# Patient Record
Sex: Female | Born: 1990 | Race: White | Hispanic: No | Marital: Married | Smoking: Former smoker
Health system: Southern US, Community
[De-identification: ages and names within clinical notes are randomized; demographics above are authoritative.]

## PROBLEM LIST (undated history)

## (undated) ENCOUNTER — Inpatient Hospital Stay (HOSPITAL_COMMUNITY): Payer: Self-pay

## (undated) DIAGNOSIS — R87619 Unspecified abnormal cytological findings in specimens from cervix uteri: Secondary | ICD-10-CM

## (undated) DIAGNOSIS — R5383 Other fatigue: Secondary | ICD-10-CM

## (undated) DIAGNOSIS — Z3490 Encounter for supervision of normal pregnancy, unspecified, unspecified trimester: Secondary | ICD-10-CM

## (undated) DIAGNOSIS — K589 Irritable bowel syndrome without diarrhea: Secondary | ICD-10-CM

## (undated) DIAGNOSIS — N926 Irregular menstruation, unspecified: Principal | ICD-10-CM

## (undated) DIAGNOSIS — R011 Cardiac murmur, unspecified: Secondary | ICD-10-CM

## (undated) DIAGNOSIS — R87629 Unspecified abnormal cytological findings in specimens from vagina: Secondary | ICD-10-CM

## (undated) DIAGNOSIS — M5136 Other intervertebral disc degeneration, lumbar region: Secondary | ICD-10-CM

## (undated) DIAGNOSIS — Z789 Other specified health status: Secondary | ICD-10-CM

## (undated) DIAGNOSIS — IMO0002 Reserved for concepts with insufficient information to code with codable children: Secondary | ICD-10-CM

## (undated) DIAGNOSIS — M51369 Other intervertebral disc degeneration, lumbar region without mention of lumbar back pain or lower extremity pain: Secondary | ICD-10-CM

## (undated) DIAGNOSIS — R1032 Left lower quadrant pain: Secondary | ICD-10-CM

## (undated) DIAGNOSIS — B977 Papillomavirus as the cause of diseases classified elsewhere: Secondary | ICD-10-CM

## (undated) DIAGNOSIS — L409 Psoriasis, unspecified: Secondary | ICD-10-CM

## (undated) HISTORY — DX: Reserved for concepts with insufficient information to code with codable children: IMO0002

## (undated) HISTORY — DX: Other intervertebral disc degeneration, lumbar region: M51.36

## (undated) HISTORY — DX: Other fatigue: R53.83

## (undated) HISTORY — DX: Other intervertebral disc degeneration, lumbar region without mention of lumbar back pain or lower extremity pain: M51.369

## (undated) HISTORY — DX: Psoriasis, unspecified: L40.9

## (undated) HISTORY — DX: Unspecified abnormal cytological findings in specimens from vagina: R87.629

## (undated) HISTORY — PX: WISDOM TOOTH EXTRACTION: SHX21

## (undated) HISTORY — DX: Left lower quadrant pain: R10.32

## (undated) HISTORY — DX: Unspecified abnormal cytological findings in specimens from cervix uteri: R87.619

## (undated) HISTORY — DX: Cardiac murmur, unspecified: R01.1

## (undated) HISTORY — PX: COLONOSCOPY: SHX174

## (undated) HISTORY — DX: Irregular menstruation, unspecified: N92.6

## (undated) NOTE — *Deleted (*Deleted)
[facility] EMERGENCY DEPARTMENT Provider Note   CSN: [id number] Arrival date & time: 08/17/20  2149     History Chief Complaint  Patient presents with  . Dizziness    [name] is a 51 y.o. female.  HPI     Past Medical History:  Diagnosis Date  . Abnormal Pap smear   . Fatigue 01/09/2015  . HPV (human papilloma virus) infection   . Irritable bowel syndrome (IBS)   . LLQ pain 01/09/2015  . Missed period 01/09/2015  . No pertinent past medical history   . Pregnant   . Vaginal Pap smear, abnormal     Patient Active Problem List   Diagnosis Date Noted  . Fatigue 01/09/2015  . Missed period 01/09/2015  . LLQ pain 01/09/2015  . Monilial vulvovaginitis 01/21/2014  . Boil of vulva 01/18/2014  . Smoker 05/01/2013    Past Surgical History:  Procedure Laterality Date  . CESAREAN SECTION  08/03/2012   Procedure: CESAREAN SECTION;  Surgeon: Tilda Burrow, MD;  Location: [facility];  Service: Obstetrics;  Laterality: N/A;  . COLONOSCOPY    . VULVAR LESION REMOVAL Left 09/11/2014   Procedure: EXCISION OF LEFT VULVAR ABSCESS WITH COMPLEX REPAIR;  Surgeon: Lazaro Arms, MD;  Location: [facility];  Service: Gynecology;  Laterality: Left;  . WISDOM TOOTH EXTRACTION       OB History    Gravida  2   Para  2   Term  2   Preterm  0   AB  0   Living  2     SAB  0   TAB  0   Ectopic  0   Multiple  0   Live Births  2           Family History  Problem Relation Age of Onset  . Miscarriages / [place] Mother   . Heart attack Mother        cardiac arrest with heart attack  . Heart disease Maternal Grandmother   . Cancer Maternal Grandfather        prostate  . Heart disease Maternal Grandfather   . Diabetes Paternal Grandmother   . Heart murmur Father   . Other Neg Hx     Social History   Tobacco Use  . Smoking status: Former Smoker    Packs/day: 0.25    Years: 7.00    Pack years: 1.75    Types: Cigarettes    Quit date: 02/08/2014    Years since  quitting: 6.5  . Smokeless tobacco: Never Used  Vaping Use  . Vaping Use: Some days  Substance Use Topics  . Alcohol use: Yes    Comment: weekly- couple drinks a week   . Drug use: No    Home Medications Prior to Admission medications   Medication Sig Start Date End Date Taking? Authorizing Provider  acetaminophen (TYLENOL) 500 MG tablet Take 500 mg by mouth every 6 (six) hours as needed.    [provider]  ibuprofen (ADVIL,MOTRIN) 200 MG tablet Take 200 mg by mouth as needed.    [provider]    Allergies    Sulfa drugs cross reactors  Review of Systems   Review of Systems  Physical Exam Updated Vital Signs BP (!) 132/93   Pulse 70   Temp 98.3 F (36.8 C) (Oral)   Resp 18   Ht 4\' 11"  (1.499 m)   Wt 55.3 kg   SpO2 100%   BMI 24.64 kg/m  Physical Exam  ED Results / Procedures / Treatments   Labs (all labs ordered are listed, but only abnormal results are displayed) Labs Reviewed  CBC WITH DIFFERENTIAL/PLATELET  BASIC METABOLIC PANEL  I-STAT BETA HCG BLOOD, ED ([facility], [facility], [facility] ONLY)    EKG None  Radiology No results found.  Procedures Procedures (including critical care time)  Medications Ordered in ED Medications - No data to display  ED Course  I have reviewed the triage vital signs and the nursing notes.  Pertinent labs & imaging results that were available during my care of the patient were reviewed by me and considered in my medical decision making (see chart for details).    MDM Rules/Calculators/A&P                          *** Final Clinical Impression(s) / ED Diagnoses Final diagnoses:  None    Rx / DC Orders ED Discharge Orders    None

---
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
## 2010-07-17 ENCOUNTER — Inpatient Hospital Stay (HOSPITAL_COMMUNITY): Admission: AD | Admit: 2010-07-17 | Discharge: 2010-07-17 | Payer: Self-pay | Admitting: Obstetrics and Gynecology

## 2010-09-11 ENCOUNTER — Inpatient Hospital Stay (HOSPITAL_COMMUNITY)
Admission: AD | Admit: 2010-09-11 | Discharge: 2010-09-11 | Payer: Self-pay | Source: Home / Self Care | Attending: Obstetrics and Gynecology | Admitting: Obstetrics and Gynecology

## 2010-12-14 LAB — URINALYSIS, ROUTINE W REFLEX MICROSCOPIC
Glucose, UA: NEGATIVE mg/dL
Ketones, ur: NEGATIVE mg/dL
Protein, ur: NEGATIVE mg/dL

## 2010-12-17 LAB — HCG, SERUM, QUALITATIVE: Preg, Serum: NEGATIVE

## 2010-12-17 LAB — URINALYSIS, ROUTINE W REFLEX MICROSCOPIC
Glucose, UA: NEGATIVE mg/dL
Hgb urine dipstick: NEGATIVE
Protein, ur: NEGATIVE mg/dL
Specific Gravity, Urine: 1.01 (ref 1.005–1.030)

## 2010-12-17 LAB — WET PREP, GENITAL: Yeast Wet Prep HPF POC: NONE SEEN

## 2010-12-17 LAB — GC/CHLAMYDIA PROBE AMP, GENITAL: Chlamydia, DNA Probe: NEGATIVE

## 2010-12-17 LAB — POCT PREGNANCY, URINE: Preg Test, Ur: NEGATIVE
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
## 2011-06-25 LAB — ACETAMINOPHEN LEVEL: Acetaminophen (Tylenol), Serum: <10 — ABNORMAL LOW

## 2011-06-25 LAB — URINE CULTURE: Colony Count: >=100000

## 2011-06-25 LAB — URINALYSIS, ROUTINE W REFLEX MICROSCOPIC
Bilirubin Urine: NEGATIVE
Glucose, UA: NEGATIVE
Hgb urine dipstick: NEGATIVE
Ketones, ur: NEGATIVE
pH: 7

## 2011-06-25 LAB — RAPID URINE DRUG SCREEN, HOSP PERFORMED
Barbiturates: NOT DETECTED
Opiates: NOT DETECTED
Tetrahydrocannabinol: NOT DETECTED

## 2011-06-25 LAB — I-STAT 8, (EC8 V) (CONVERTED LAB)
BUN: 7
Bicarbonate: 27.1 — ABNORMAL HIGH
Glucose, Bld: 82
Sodium: 140

## 2011-06-25 LAB — POCT I-STAT CREATININE: Creatinine, Ser: 0.9

## 2011-06-25 LAB — URINE MICROSCOPIC-ADD ON

## 2011-06-25 LAB — POCT PREGNANCY, URINE: Preg Test, Ur: NEGATIVE

## 2011-06-30 LAB — URINALYSIS, ROUTINE W REFLEX MICROSCOPIC
Glucose, UA: NEGATIVE
Nitrite: NEGATIVE
Protein, ur: NEGATIVE
Urobilinogen, UA: 0.2

## 2011-06-30 LAB — POCT PREGNANCY, URINE: Preg Test, Ur: NEGATIVE

## 2011-07-09 LAB — POCT PREGNANCY, URINE: Operator id: 148111

## 2011-07-22 LAB — POCT URINALYSIS DIP (DEVICE)
Hgb urine dipstick: NEGATIVE
Ketones, ur: NEGATIVE
Protein, ur: NEGATIVE
Specific Gravity, Urine: 1.02
pH: 6

## 2011-12-05 ENCOUNTER — Encounter (HOSPITAL_COMMUNITY): Payer: Self-pay | Admitting: *Deleted

## 2011-12-05 ENCOUNTER — Inpatient Hospital Stay (HOSPITAL_COMMUNITY)
Admission: AD | Admit: 2011-12-05 | Discharge: 2011-12-05 | Disposition: A | Discharge status: Home / Self Care | Source: Ambulatory Visit | Attending: Obstetrics & Gynecology | Admitting: Obstetrics & Gynecology

## 2011-12-05 ENCOUNTER — Inpatient Hospital Stay (HOSPITAL_COMMUNITY)

## 2011-12-05 DIAGNOSIS — O34599 Maternal care for other abnormalities of gravid uterus, unspecified trimester: Secondary | ICD-10-CM | POA: Insufficient documentation

## 2011-12-05 DIAGNOSIS — O200 Threatened abortion: Secondary | ICD-10-CM

## 2011-12-05 DIAGNOSIS — N831 Corpus luteum cyst of ovary, unspecified side: Secondary | ICD-10-CM | POA: Insufficient documentation

## 2011-12-05 DIAGNOSIS — R1032 Left lower quadrant pain: Secondary | ICD-10-CM | POA: Insufficient documentation

## 2011-12-05 HISTORY — DX: Papillomavirus as the cause of diseases classified elsewhere: B97.7

## 2011-12-05 HISTORY — DX: Irritable bowel syndrome, unspecified: K58.9

## 2011-12-05 LAB — POCT PREGNANCY, URINE: Preg Test, Ur: POSITIVE — AB

## 2011-12-05 LAB — URINALYSIS, ROUTINE W REFLEX MICROSCOPIC
Bilirubin Urine: NEGATIVE
Glucose, UA: NEGATIVE mg/dL
Ketones, ur: NEGATIVE mg/dL
Specific Gravity, Urine: >1.03 — ABNORMAL HIGH (ref 1.005–1.030)
pH: 6 (ref 5.0–8.0)

## 2011-12-05 LAB — URINE MICROSCOPIC-ADD ON

## 2011-12-05 LAB — ABO/RH: ABO/RH(D): O POS

## 2011-12-05 MED ORDER — PRENATAL RX 60-1 MG PO TABS: 1.0000 | ORAL_TABLET | Freq: Every day | ORAL | Status: DC

## 2011-12-05 NOTE — Progress Notes (Signed)
M. Williams, CNM at bedside.  Assessment done and poc discussed with pt.   

## 2011-12-05 NOTE — ED Provider Notes (Signed)
History     Chief Complaint  Patient presents with  . Possible Pregnancy   HPI This is a 21 y.o. at [redacted] weeks gestation by LMP who presents with c/o LLQ pain and positive pregnancy tests.  Is worried about ectopic because she had a HSG a year ago and thought it might be a risk for ectopic. No bleeding.  OB History    Grav Para Term Preterm Abortions TAB SAB Ect Mult Living   2 0 0 0 0 0 0 0 0 0       Past Medical History  Diagnosis Date  . HPV (human papilloma virus) infection   . Irritable bowel syndrome (IBS)     Past Surgical History  Procedure Date  . Wisdom tooth extraction     History reviewed. No pertinent family history.  History  Substance Use Topics  . Smoking status: Current Everyday Smoker -- 0.2 packs/day  . Smokeless tobacco: Not on file  . Alcohol Use: Yes    Allergies:  Allergies  Allergen Reactions  . Sulfa Drugs Cross Reactors Hives    Prescriptions prior to admission  Medication Sig Dispense Refill  . fluocinonide gel (LIDEX) 0.05 % Apply 1 application topically daily as needed. For psoriasis        ROS As Above  Physical Exam   Blood pressure 115/60, pulse 94, temperature 97.6 F (36.4 C), temperature source Oral, resp. rate 20, height 4\' 11"  (1.499 m), weight 118 lb (53.524 kg), last menstrual period 11/04/2011.  Physical Exam  Constitutional: She is oriented to person, place, and time. She appears well-developed and well-nourished. No distress.  HENT:  Head: Normocephalic.  Cardiovascular: Normal rate.   Respiratory: Effort normal.  GI: Soft. She exhibits no distension and no mass. There is tenderness (mild on LLQ). There is no rebound and no guarding.  Genitourinary: Vagina normal and uterus normal. No vaginal discharge found.       Uterus nontender.  Slight tenderness LLQ, no blood in vault  Musculoskeletal: Normal range of motion.  Neurological: She is alert and oriented to person, place, and time.  Skin: Skin is warm and dry.    Psychiatric: She has a normal mood and affect.   Results for orders placed during the hospital encounter of 12/05/11 (from the past 24 hour(s))  URINALYSIS, ROUTINE W REFLEX MICROSCOPIC     Status: Abnormal   Collection Time   12/05/11  8:03 PM      Component Value Range   Color, Urine YELLOW  YELLOW    APPearance CLEAR  CLEAR    Specific Gravity, Urine >1.030 (*) 1.005 - 1.030    pH 6.0  5.0 - 8.0    Glucose, UA NEGATIVE  NEGATIVE (mg/dL)   Hgb urine dipstick TRACE (*) NEGATIVE    Bilirubin Urine NEGATIVE  NEGATIVE    Ketones, ur NEGATIVE  NEGATIVE (mg/dL)   Protein, ur NEGATIVE  NEGATIVE (mg/dL)   Urobilinogen, UA 0.2  0.0 - 1.0 (mg/dL)   Nitrite NEGATIVE  NEGATIVE    Leukocytes, UA NEGATIVE  NEGATIVE   URINE MICROSCOPIC-ADD ON     Status: Abnormal   Collection Time   12/05/11  8:03 PM      Component Value Range   Squamous Epithelial / LPF FEW (*) RARE    WBC, UA 0-2  <3 (WBC/hpf)   RBC / HPF 0-2  <3 (RBC/hpf)   Bacteria, UA FEW (*) RARE   POCT PREGNANCY, URINE     Status: Abnormal  Collection Time   12/05/11  8:07 PM      Component Value Range   Preg Test, Ur POSITIVE (*) NEGATIVE   HCG, QUANTITATIVE, PREGNANCY     Status: Abnormal   Collection Time   12/05/11  9:05 PM      Component Value Range   hCG, Beta Chain, Quant, S 1319 (*) <5 (mIU/mL)  ABO/RH     Status: Normal   Collection Time   12/05/11  9:08 PM      Component Value Range   ABO/RH(D) O POS    WET PREP, GENITAL     Status: Abnormal   Collection Time   12/05/11  9:32 PM      Component Value Range   Yeast Wet Prep HPF POC NONE SEEN  NONE SEEN    Trich, Wet Prep NONE SEEN  NONE SEEN    Clue Cells Wet Prep HPF POC FEW (*) NONE SEEN    WBC, Wet Prep HPF POC FEW (*) NONE SEEN      US Ob Transvaginal  12/05/2011  *RADIOLOGY REPORT*  Clinical Data: Lower quadrant pain.  OBSTETRIC <14 WK [place] AND TRANSVAGINAL OB US  Technique:  Both transabdominal and transvaginal ultrasound examinations were performed for complete  evaluation of the gestation as well as the maternal uterus, adnexal regions, and pelvic cul-de-sac.  Transvaginal technique was performed to assess early pregnancy.  Comparison:  None.  Intrauterine gestational sac:  Single Yolk sac: No Embryo: No Cardiac Activity: No  MSD: 2.7  mm  4    w 5    d [place] EDC: 08/08/2012  Maternal uterus/adnexae: 2.1 cm cyst on the right ovary, probably a corpus luteum.  Left ovary is normal. No free fluid.  IMPRESSION: Probable very early intrauterine pregnancy.  Original Report Authenticated By: Gwynn Burly, M.D.   MAU Course  Procedures  Assessment and Plan  A:  Pregnancy at 4.5 weeks      Cannot rule out ectopic      R CLC P:  Reviewed with patient and her husband       Will repeat Quant in 48 hrs        Followup US in 1-2 weeks  [facility] 12/05/2011, 11:07 PM

## 2011-12-05 NOTE — Discharge Instructions (Signed)
Threatened Miscarriage  A threatened miscarriage is a pregnancy that may end. It may be marked by bleeding during the first 20 weeks of pregnancy. Often, the pregnancy can continue without any more problems. You may be asked to stop:  Having sex (intercourse).   Having orgasms.   Using tampons.   Exercising.   Doing heavy physical activity and work.  HOME CARE   Your doctor may tell you to take bed rest and to stop activities and work.   Write down the number of pads you use each day. Write down how often you change pads. Write down how soaked they are.   Follow your doctor's advice for follow-up visits and tests.   If your blood type is Rh-negative and the father's blood is Rh-positive (or is not known), you may get a shot to protect the baby.   If you have a miscarriage, save all the tissue you pass in a container. Take the container to your doctor.  GET HELP RIGHT AWAY IF:   You have bad cramps or pain in your belly (abdomen), lower belly, or back.   You have a fever or chills.   Your bleeding gets worse or you pass large clots of blood or tissue. Save this tissue to show your doctor.   You feel lightheaded, weak, dizzy, or pass out (faint).   You have a gush of fluid from your vagina.  MAKE SURE YOU:   Understand these instructions.   Will watch your condition.   Will get help right away if you are not doing well or get worse.  Document Released: 09/02/2008 Document Revised: 09/09/2011 Document Reviewed: 10/06/2009 ExitCare Patient Information 2012 [employer]. 

## 2011-12-05 NOTE — Progress Notes (Signed)
SSE per CNM.  Wet prep and cultures collected.  VE done.  

## 2011-12-06 LAB — GC/CHLAMYDIA PROBE AMP, GENITAL: GC Probe Amp, Genital: NEGATIVE

## 2011-12-07 ENCOUNTER — Encounter (HOSPITAL_COMMUNITY): Payer: Self-pay | Admitting: *Deleted

## 2011-12-07 ENCOUNTER — Inpatient Hospital Stay (HOSPITAL_COMMUNITY)
Admission: AD | Admit: 2011-12-07 | Discharge: 2011-12-07 | Disposition: A | Discharge status: Home / Self Care | Source: Ambulatory Visit | Attending: Obstetrics & Gynecology | Admitting: Obstetrics & Gynecology

## 2011-12-07 DIAGNOSIS — R109 Unspecified abdominal pain: Secondary | ICD-10-CM | POA: Insufficient documentation

## 2011-12-07 DIAGNOSIS — O26899 Other specified pregnancy related conditions, unspecified trimester: Secondary | ICD-10-CM

## 2011-12-07 DIAGNOSIS — O99891 Other specified diseases and conditions complicating pregnancy: Secondary | ICD-10-CM | POA: Insufficient documentation

## 2011-12-07 HISTORY — DX: Other specified health status: Z78.9

## 2011-12-07 LAB — HCG, QUANTITATIVE, PREGNANCY: hCG, Beta Chain, Quant, S: 2912 m[IU]/mL — ABNORMAL HIGH (ref <5)

## 2011-12-07 LAB — URINE CULTURE
Colony Count: 5000
Culture  Setup Time: 201303040431

## 2011-12-07 NOTE — ED Provider Notes (Signed)
History   Chief Complaint:  Follow-up   [name] is  21 y.o. G2P0000 Patient's last menstrual period was 11/04/2011.Marland Kitchen Patient is here for follow up of quantitative HCG and ongoing surveillance of pregnancy status.     Since her last visit, the patient is without new complaint.   The patient reports bleeding as  none now.    General ROS:  negative  Her previous Quantitative HCG values are:12/05/2011: 1319    Physical Exam   Blood pressure 98/52, temperature 98.2 F (36.8 C), temperature source Oral, resp. rate 18, height 4' 10.5" (1.486 m), weight 116 lb 2 oz (52.674 kg), last menstrual period 11/04/2011.  Focused Gynecological Exam: examination not indicated  Labs: Recent Results (from the past 24 hour(s))  HCG, QUANTITATIVE, PREGNANCY   Collection Time   12/07/11  2:55 PM      Component Value Range   hCG, Beta Chain, Quant, S 2912 (*) <5 (mIU/mL)    Ultrasound Studies:   US Ob Comp Less 14 Wks  12/05/2011  *RADIOLOGY REPORT*  Clinical Data: Lower quadrant pain.  OBSTETRIC <14 WK [place] AND TRANSVAGINAL OB US  Technique:  Both transabdominal and transvaginal ultrasound examinations were performed for complete evaluation of the gestation as well as the maternal uterus, adnexal regions, and pelvic cul-de-sac.  Transvaginal technique was performed to assess early pregnancy.  Comparison:  None.  Intrauterine gestational sac:  Single Yolk sac: No Embryo: No Cardiac Activity: No  MSD: 2.7  mm  4    w 5    d [place] EDC: 08/08/2012  Maternal uterus/adnexae: 2.1 cm cyst on the right ovary, probably a corpus luteum.  Left ovary is normal. No free fluid.  IMPRESSION: Probable very early intrauterine pregnancy.  Original Report Authenticated By: Gwynn Burly, M.D.    Assessment: + UPT with normally rising Quants   Plan: Discharge home FU 12/22/11 @ 2:30pm for Repeat viability [place] Referral to [facility] for [facility]  Channon Ambrosini E. 12/07/2011, 4:55 PM

## 2011-12-07 NOTE — Progress Notes (Signed)
Pt states, " I am here for a repeat BHCG."

## 2011-12-07 NOTE — Discharge Instructions (Signed)
Abdominal Pain During Pregnancy  Abdominal discomfort is common in pregnancy. Most of the time, it does not cause harm. There are many causes of abdominal pain. Some causes are more serious than others. Some of the causes of abdominal pain in pregnancy are easily diagnosed. Occasionally, the diagnosis takes time to understand. Other times, the cause is not determined. Abdominal pain can be a sign that something is very wrong with the pregnancy, or the pain may have nothing to do with the pregnancy at all. For this reason, always tell your caregiver if you have any abdominal discomfort.  CAUSES  Common and harmless causes of abdominal pain include:   Constipation.   Excess gas and bloating.   Round ligament pain. This is pain that is felt in the folds of the groin.   The position the baby or placenta is in.   Baby kicks.   Braxton-Hicks contractions. These are mild contractions that do not cause cervical dilation.  Serious causes of abdominal pain include:   Ectopic pregnancy. This happens when a fertilized egg implants outside of the uterus.   Miscarriage.   Preterm labor. This is when labor starts at less than 37 weeks of pregnancy.   Placental abruption. This is when the placenta partially or completely separates from the uterus.   Preeclampsia. This is often associated with high blood pressure and has been referred to as "toxemia in pregnancy."   Uterine or amniotic fluid infections.  Causes unrelated to pregnancy include:   Urinary tract infection.   Gallbladder stones or inflammation.   Hepatitis or other liver illness.   Intestinal problems, stomach flu, food poisoning, or ulcer.   Appendicitis.   Kidney (renal) stones.   Kidney infection (pylonephritis).  HOME CARE INSTRUCTIONS   For mild pain:   Do not have sexual intercourse or put anything in your vagina until your symptoms go away completely.   Get plenty of rest until your pain improves. If your pain does not improve in 1 hour, call  your caregiver.   Drink clear fluids if you feel nauseous. Avoid solid food as long as you are uncomfortable or nauseous.   Only take medicine as directed by your caregiver.   Keep all follow-up appointments with your caregiver.  SEEK IMMEDIATE MEDICAL CARE IF:   You are bleeding, leaking fluid, or passing tissue from the vagina.   You have increasing pain or cramping.   You have persistent vomiting.   You have painful or bloody urination.   You have a fever.   You notice a decrease in your baby's movements.   You have extreme weakness or feel faint.   You have shortness of breath, with or without abdominal pain.   You develop a severe headache with abdominal pain.   You have abnormal vaginal discharge with abdominal pain.   You have persistent diarrhea.   You have abdominal pain that continues even after rest, or gets worse.  MAKE SURE YOU:    Understand these instructions.   Will watch your condition.   Will get help right away if you are not doing well or get worse.  Document Released: 09/20/2005 Document Revised: 09/09/2011 Document Reviewed: 04/16/2011  ExitCare Patient Information 2012 [employer].

## 2011-12-07 NOTE — ED Provider Notes (Signed)
Medical Screening exam and patient care preformed by advanced practice provider.  Agree with the above management.  

## 2011-12-22 ENCOUNTER — Encounter (HOSPITAL_COMMUNITY): Payer: Self-pay

## 2011-12-22 ENCOUNTER — Other Ambulatory Visit (HOSPITAL_COMMUNITY): Payer: Self-pay | Admitting: Physician Assistant

## 2011-12-22 ENCOUNTER — Ambulatory Visit (HOSPITAL_COMMUNITY)
Admit: 2011-12-22 | Discharge: 2011-12-22 | Disposition: A | Discharge status: Home / Self Care | Attending: Obstetrics & Gynecology | Admitting: Obstetrics & Gynecology

## 2011-12-22 DIAGNOSIS — O26899 Other specified pregnancy related conditions, unspecified trimester: Secondary | ICD-10-CM

## 2011-12-22 DIAGNOSIS — R109 Unspecified abdominal pain: Secondary | ICD-10-CM

## 2011-12-22 DIAGNOSIS — O3680X0 Pregnancy with inconclusive fetal viability, not applicable or unspecified: Secondary | ICD-10-CM | POA: Insufficient documentation

## 2011-12-22 DIAGNOSIS — Z3689 Encounter for other specified antenatal screening: Secondary | ICD-10-CM | POA: Insufficient documentation

## 2012-01-05 ENCOUNTER — Other Ambulatory Visit (HOSPITAL_COMMUNITY)
Admission: RE | Admit: 2012-01-05 | Discharge: 2012-01-05 | Disposition: A | Discharge status: Home / Self Care | Source: Ambulatory Visit | Attending: Obstetrics and Gynecology | Admitting: Obstetrics and Gynecology

## 2012-01-05 ENCOUNTER — Other Ambulatory Visit: Payer: Self-pay | Admitting: Adult Health

## 2012-01-05 DIAGNOSIS — Z01419 Encounter for gynecological examination (general) (routine) without abnormal findings: Secondary | ICD-10-CM | POA: Insufficient documentation

## 2012-01-05 LAB — OB RESULTS CONSOLE ABO/RH: RH Type: POSITIVE

## 2012-01-05 LAB — OB RESULTS CONSOLE HEPATITIS B SURFACE ANTIGEN: Hepatitis B Surface Ag: NEGATIVE

## 2012-01-05 LAB — OB RESULTS CONSOLE HIV ANTIBODY (ROUTINE TESTING): HIV: NONREACTIVE

## 2012-06-05 ENCOUNTER — Emergency Department (HOSPITAL_COMMUNITY)
Admission: EM | Admit: 2012-06-05 | Discharge: 2012-06-05 | Disposition: A | Discharge status: Home / Self Care | Attending: Emergency Medicine | Admitting: Emergency Medicine

## 2012-06-05 ENCOUNTER — Encounter (HOSPITAL_COMMUNITY): Payer: Self-pay | Admitting: *Deleted

## 2012-06-05 DIAGNOSIS — Z3490 Encounter for supervision of normal pregnancy, unspecified, unspecified trimester: Secondary | ICD-10-CM

## 2012-06-05 DIAGNOSIS — O99330 Smoking (tobacco) complicating pregnancy, unspecified trimester: Secondary | ICD-10-CM | POA: Insufficient documentation

## 2012-06-05 DIAGNOSIS — O368190 Decreased fetal movements, unspecified trimester, not applicable or unspecified: Secondary | ICD-10-CM | POA: Insufficient documentation

## 2012-06-05 DIAGNOSIS — Z882 Allergy status to sulfonamides status: Secondary | ICD-10-CM | POA: Insufficient documentation

## 2012-06-05 HISTORY — DX: Encounter for supervision of normal pregnancy, unspecified, unspecified trimester: Z34.90

## 2012-06-05 LAB — URINALYSIS, ROUTINE W REFLEX MICROSCOPIC
Glucose, UA: 100 mg/dL — AB
Leukocytes, UA: NEGATIVE
Nitrite: NEGATIVE
Protein, ur: NEGATIVE mg/dL
pH: 6 (ref 5.0–8.0)

## 2012-06-05 NOTE — ED Notes (Addendum)
Pt says she had NVD last pm.  Today has had decreased fetal movement and  Pressure.  Alert, No vag bleeding or cramping.

## 2012-06-05 NOTE — ED Notes (Signed)
Pt states she and her husband had some food las night that upset their stomach and today she noticed the baby was not moving as much. Pt became concerned about the decreased fetal movement and came to the ER.

## 2012-06-05 NOTE — ED Provider Notes (Signed)
History     CSN: [id number]  Arrival date & time 06/05/12  4098   First MD Initiated Contact with Patient 06/05/12 1901      Chief Complaint  Patient presents with  . Decreased Fetal Movement    (Consider location/radiation/quality/duration/timing/severity/associated sxs/prior treatment) HPI Comments: G1P0 female comes in with cc of decreased fetal movements. PT is in her 3rd trimester. States that today, she has not felt any fetal movement  - until shew got to the ED, and patient got scared. She has some crampy lower quadrant pain and lower back pain - but no UTI like sx, and no vaginal bleeding, discharge.  Pt denies trauma.   The history is provided by the patient.    Past Medical History  Diagnosis Date  . HPV (human papilloma virus) infection   . Irritable bowel syndrome (IBS)   . No pertinent past medical history   . Pregnant     Past Surgical History  Procedure Date  . Wisdom tooth extraction     History reviewed. No pertinent family history.  History  Substance Use Topics  . Smoking status: Current Everyday Smoker -- 0.2 packs/day    Types: Cigarettes  . Smokeless tobacco: Not on file  . Alcohol Use: No    OB History    Grav Para Term Preterm Abortions TAB SAB Ect Mult Living   1 0 0 0 0 0 0 0 0 0       Review of Systems  Constitutional: Positive for activity change.  HENT: Negative for neck pain.   Respiratory: Negative for shortness of breath.   Cardiovascular: Negative for chest pain.  Gastrointestinal: Positive for abdominal pain. Negative for nausea and vomiting.  Genitourinary: Negative for dysuria, frequency, decreased urine volume, vaginal bleeding and vaginal discharge.  Musculoskeletal: Positive for back pain.  Neurological: Negative for headaches.    Allergies  Sulfa drugs cross reactors  Home Medications   Current Outpatient Rx  Name Route Sig Dispense Refill  . OVER THE COUNTER MEDICATION Oral Take 1 tablet by mouth at  bedtime. ONE-A-DAY WOMENS with DHA      BP 138/63  Pulse 85  Temp 98.4 F (36.9 C) (Oral)  Resp 20  Ht 4\' 11"  (1.499 m)  Wt 150 lb (68.04 kg)  BMI 30.30 kg/m2  SpO2 100%  LMP 11/04/2011  Physical Exam  Nursing note and vitals reviewed. Constitutional: She is oriented to person, place, and time. She appears well-developed and well-nourished.  HENT:  Head: Normocephalic and atraumatic.  Eyes: EOM are normal. Pupils are equal, round, and reactive to light.  Neck: Neck supple.  Cardiovascular: Normal rate, regular rhythm and normal heart sounds.   No murmur heard. Pulmonary/Chest: Effort normal. No respiratory distress.  Abdominal: Soft. She exhibits no distension. There is no tenderness. There is no rebound and no guarding.  Genitourinary:       Pt on fetal heart monitoring - HR of the fetus is between 150 and 170 during MD evaluation  Neurological: She is alert and oriented to person, place, and time.  Skin: Skin is warm and dry.    ED Course  Procedures (including critical care time)  Labs Reviewed  URINALYSIS, ROUTINE W REFLEX MICROSCOPIC - Abnormal; Notable for the following:    Glucose, UA 100 (*)     All other components within normal limits  URINE CULTURE   No results found.   1. Pregnancy       MDM  Pregnant woman comes in with  cc of not feeling fetal movements. In the ED fetal heart tones are WNL, and patient is feeling the movement now. With the suprapubic pain and back pain, will check a UA. - UCx.       Derwood Kaplan, MD 06/05/12 2207

## 2012-06-05 NOTE — Progress Notes (Signed)
Call from [facility] ED regarding pt who has presented to ED with c/o decrease FM. Pt denies any pain or other complaints. EFM applied. Pt is seen for OB care at [facility].

## 2012-06-06 LAB — URINE CULTURE: Colony Count: >=100000

## 2012-06-07 NOTE — ED Notes (Signed)
Chart returned from EDP office   

## 2012-06-10 NOTE — ED Notes (Signed)
+  Urine. Chart sent to EDP office for review. Chart returned from EDP office. "If patient not treated with abx and if no allergies: Keflex 500 mg 1 QID #40." Prescribed by Grant Fontana PA-C.

## 2012-06-11 NOTE — ED Notes (Signed)
Rx called to [facility] in [place] by Steward Ros PFM.

## 2012-06-11 NOTE — ED Notes (Signed)
Called patient and informed them of +result and new Rx. Wants Rx called to [facility] in [place].

## 2012-06-26 ENCOUNTER — Inpatient Hospital Stay (HOSPITAL_COMMUNITY)
Admission: AD | Admit: 2012-06-26 | Discharge: 2012-06-26 | Disposition: A | Discharge status: Home / Self Care | Source: Ambulatory Visit | Attending: Obstetrics & Gynecology | Admitting: Obstetrics & Gynecology

## 2012-06-26 ENCOUNTER — Encounter (HOSPITAL_COMMUNITY): Payer: Self-pay | Admitting: *Deleted

## 2012-06-26 DIAGNOSIS — O99891 Other specified diseases and conditions complicating pregnancy: Secondary | ICD-10-CM | POA: Insufficient documentation

## 2012-06-26 DIAGNOSIS — N949 Unspecified condition associated with female genital organs and menstrual cycle: Secondary | ICD-10-CM

## 2012-06-26 DIAGNOSIS — O9989 Other specified diseases and conditions complicating pregnancy, childbirth and the puerperium: Secondary | ICD-10-CM

## 2012-06-26 DIAGNOSIS — R109 Unspecified abdominal pain: Secondary | ICD-10-CM | POA: Insufficient documentation

## 2012-06-26 NOTE — MAU Note (Signed)
Pt states lower abd cramping constant pain started today about 1400.  Denies vaginal bleeding or ROM.  Good fetal movement.

## 2012-06-26 NOTE — MAU Provider Note (Signed)
  History     CSN: [id number]  Arrival date and time: 06/26/12 2131   First Provider Initiated Contact with Patient 06/26/12 2229      Chief Complaint  Patient presents with  . Labor Eval   HPI Comments: 21 yo G1P0 @ 33.4 by LMP and early U/S p/w abdominal cramping/pain since 1400.  Pain satrted LLQ/pelvic area, then became bilateral pelvic/LQ.  Has a dull/achy pain constantly with intermittent worsening of the pain, every 30 min or so, which lasts for a few minutes.  No vaginal bleeding or LOF. +FM. No complications this pregnancy.  Next appt at [facility] is tomorrow.    OB History    Grav Para Term Preterm Abortions TAB SAB Ect Mult Living   1 0 0 0 0 0 0 0 0 0       Past Medical History  Diagnosis Date  . HPV (human papilloma virus) infection   . Irritable bowel syndrome (IBS)   . Pregnant     Past Surgical History  Procedure Date  . Wisdom tooth extraction     Family History  Problem Relation Age of Onset  . Other Neg Hx     History  Substance Use Topics  . Smoking status: Current Every Day Smoker -- 0.2 packs/day    Types: Cigarettes  . Smokeless tobacco: Not on file  . Alcohol Use: No    Allergies:  Allergies  Allergen Reactions  . Sulfa Drugs Cross Reactors Hives    Prescriptions prior to admission  Medication Sig Dispense Refill  . Docosahexaenoic Acid (DHA COMPLETE PO) Take 1 tablet by mouth daily.      . Multiple Vitamin (MULTIVITAMIN WITH MINERALS) TABS Take 1 tablet by mouth daily.        ROS Physical Exam   Blood pressure 119/60, pulse 87, temperature 98 F (36.7 C), temperature source Oral, resp. rate 20, height 4\' 11"  (1.499 m), weight 68.04 kg (150 lb), last menstrual period 11/04/2011, SpO2 98.00%.  Physical Exam  Constitutional: She appears well-developed and well-nourished. No distress.  Cardiovascular: Normal rate and regular rhythm.   No murmur heard. Respiratory: Effort normal and breath sounds normal. No respiratory distress.  GI:       gravid  Musculoskeletal: She exhibits no edema.  Skin: Skin is dry.   Dilation: Closed Effacement (%): Thick Cervical Position: Middle Exam by:: L. Paschal, RN  FHT: 135, mod variability, + accels, no decels Toco: Occasional ctx  MAU Course  Procedures  MDM Pt with abdominal pain, likely ligamentous pain/stretching with braxton-hicks ctx. Pt comfortable with going home  Assessment and Plan  21 yo G1 @33 .4 p/w low abdominal pain c/w ligamentous pain -D/c home with preterm labor precautions -F/u at [facility] tomorrow, as scheduled  Kasmira Cacioppo 06/26/2012, 10:29 PM

## 2012-06-27 NOTE — MAU Provider Note (Signed)
I saw and examined patient and agree with above. I read and interpreted NST -baseline 135-140, moderate variability, accels present, no decels, occasional contraction.

## 2012-07-19 ENCOUNTER — Encounter (HOSPITAL_COMMUNITY): Payer: Self-pay | Admitting: *Deleted

## 2012-07-19 ENCOUNTER — Telehealth (HOSPITAL_COMMUNITY): Payer: Self-pay | Admitting: *Deleted

## 2012-07-19 ENCOUNTER — Other Ambulatory Visit: Payer: Self-pay | Admitting: Obstetrics and Gynecology

## 2012-07-19 NOTE — Telephone Encounter (Signed)
Preadmission screen  

## 2012-07-19 NOTE — H&P (Signed)
  [name] is a 21 y.o. female presenting for external Cephalic version at 37.0 weeks.  Ultrasound this week at [facility] reveals a healthy infant in frank breech presentation, with posterior fundal placenta, and fluid volume of 12 . Patient is counseled over the rationale and risks for ext version, including the infrequent but unavoidable risk of fetal distress requiring immediate intervention. Success rate of 50% estimated. Alternatives including primary cesarean explained to patient , and she chooses to proceed with ext version effort. Patient is 37.0 weeks as of day of version.  Blood type is O positive History OB History    Grav Para Term Preterm Abortions TAB SAB Ect Mult Living   1 0 0 0 0 0 0 0 0 0      Past Medical History  Diagnosis Date  . HPV (human papilloma virus) infection   . Irritable bowel syndrome (IBS)   . Pregnant   . Headache   . Abnormal Pap smear    Past Surgical History  Procedure Date  . Wisdom tooth extraction    Family History: family history includes Cancer in her maternal grandfather; Diabetes in her paternal grandmother; Heart disease in her maternal grandmother; and Miscarriages / Stillbirths in her mother.  There is no history of Other. Social History:  reports that she has been smoking Cigarettes.  She has been smoking about .25 packs per day. She has never used smokeless tobacco. She reports that she does not drink alcohol or use illicit drugs.   Prenatal Transfer Tool  Maternal Diabetes: No Genetic Screening: Normal Maternal Ultrasounds/Referrals: Abnormal:  Findings:   Other: breech Fetal Ultrasounds or other Referrals:  None Maternal Substance Abuse:  No Significant Maternal Medications:  None Significant Maternal Lab Results:  Lab values include: Other: GBS pending , collected 10/15.13 Other Comments:  husband had shingles in mid-pregnancy, patient avoided contact with husband  at this time.UTI in September, Rx'd Keflex  ROS    Last  menstrual period 11/04/2011. Exam Physical Exam  Prenatal labs: ABO, Rh: O/Positive/-- (04/03 0000) Antibody: Negative (04/03 0000) Rubella: Immune (04/03 0000) RPR: Nonreactive (04/03 0000)  HBsAg: Negative (04/03 0000)  HIV: Non-reactive (04/03 0000)  GBS:   pending as of 10/17  Assessment/Plan: Pregnancy 37.0 weeks, breech presentation. Rh pos Desire for external version, planned after terbutaline.  [name] V 07/19/2012, 9:43 PM

## 2012-07-20 ENCOUNTER — Encounter (HOSPITAL_COMMUNITY): Payer: Self-pay

## 2012-07-20 ENCOUNTER — Observation Stay (HOSPITAL_COMMUNITY)
Admission: RE | Admit: 2012-07-20 | Discharge: 2012-07-20 | Disposition: A | Discharge status: Home / Self Care | Source: Ambulatory Visit | Attending: Obstetrics and Gynecology | Admitting: Obstetrics and Gynecology

## 2012-07-20 VITALS — BP 133/71 | HR 106 | Resp 18

## 2012-07-20 DIAGNOSIS — O321XX0 Maternal care for breech presentation, not applicable or unspecified: Principal | ICD-10-CM | POA: Insufficient documentation

## 2012-07-20 MED ORDER — SODIUM CHLORIDE 0.9 % IV SOLN: 250.0000 mL | INTRAVENOUS | Status: DC | PRN

## 2012-07-20 MED ORDER — TERBUTALINE SULFATE 1 MG/ML IJ SOLN
0.2500 mg | Freq: Once | INTRAMUSCULAR | Status: AC
  Administered 2012-07-20: 0.25 mg via SUBCUTANEOUS
  Filled 2012-07-20: qty 1

## 2012-07-20 MED ORDER — SODIUM CHLORIDE 0.9 % IJ SOLN: 3.0000 mL | Freq: Two times a day (BID) | INTRAMUSCULAR | Status: DC

## 2012-07-20 MED ORDER — SODIUM CHLORIDE 0.9 % IJ SOLN: 3.0000 mL | INTRAMUSCULAR | Status: DC | PRN

## 2012-07-20 NOTE — Discharge Planning (Signed)
EXTERNAL Version: UNSUCCESSFUL  pRE-PROCEDURE PREGNANCY 37WEEKS BREECH PRESENTATION  POST PROCEDURE:  SAME  PROCEDURE: EXTERNAL VERSION, UNSUCCESSFUL  Loyalty Arentz ASST: MELISSA WILKINS RN] TOCOLYTIC: TERBUTALINE 0.25 PROCEDURE;  FETAL MONITORING DOCUMENTED REACTIVE NST CRITERIA. TERBUTALINE ADMINISTERED ULTRASOUND GUIDANCE ATTEMPTED. 3 GOOD EFFORTS AT FORWARD ROLL COUNTERCLOCKWISE VERSION ATTEMPTED, WITH INTERMITTENT DOCUMENTATION OF FETAL HEART RATE ABOVE 120. VERTEX ABLE TO BE MOVED TO RLQ, BUT UNABLE TO COMPLETELY DISLODGE FEET AND BUTTOCKS FROM LOWER UTERINE SEGMENT. FINGER IN ANTERIOR FORNIX ATTEMPTED THE LAST TRY. PATIENT AND BABY TOLERATED PROCEDURE WELL BLOOD TYPE RH POSITIVE POST PROCEDURE NST TO BE OBTAINED.

## 2012-07-21 ENCOUNTER — Encounter (HOSPITAL_COMMUNITY): Payer: Self-pay

## 2012-07-24 ENCOUNTER — Encounter (HOSPITAL_COMMUNITY): Payer: Self-pay | Admitting: *Deleted

## 2012-07-24 ENCOUNTER — Inpatient Hospital Stay (HOSPITAL_COMMUNITY)
Admission: AD | Admit: 2012-07-24 | Discharge: 2012-07-24 | Disposition: A | Discharge status: Hospice | Payer: Medicaid Other | Source: Ambulatory Visit | Attending: Obstetrics and Gynecology | Admitting: Obstetrics and Gynecology

## 2012-07-24 DIAGNOSIS — O321XX0 Maternal care for breech presentation, not applicable or unspecified: Secondary | ICD-10-CM

## 2012-07-24 DIAGNOSIS — O368190 Decreased fetal movements, unspecified trimester, not applicable or unspecified: Secondary | ICD-10-CM | POA: Insufficient documentation

## 2012-07-24 NOTE — MAU Note (Signed)
Patient states she has felt only one fetal movement today. Had an unsuccessful version last week. Scheduled for a repeat cesarean section on 10-31.

## 2012-07-24 NOTE — MAU Note (Signed)
Patient states she had a white creamy discharge for several hours yesterday. Continues to feel wet today.

## 2012-07-24 NOTE — MAU Provider Note (Signed)
  History     CSN: [id number]  Arrival date and time: 07/24/12 1531   None     Chief Complaint  Patient presents with  . Decreased Fetal Movement   HPI  [name] is a 21 y.o. G1P0000 @ [redacted]w[redacted]d who presents today with decreased fetal movement. She states that prior to arrival she had not felt the baby move since about midnight last night. Since arrival she has been feeling regular fetal movement.   Past Medical History  Diagnosis Date  . HPV (human papilloma virus) infection   . Irritable bowel syndrome (IBS)   . Pregnant   . Headache   . Abnormal Pap smear     Past Surgical History  Procedure Date  . Wisdom tooth extraction     Family History  Problem Relation Age of Onset  . Other Neg Hx   . Miscarriages / [place] Mother   . Heart disease Maternal Grandmother   . Cancer Maternal Grandfather     prostate  . Diabetes Paternal Grandmother     History  Substance Use Topics  . Smoking status: Current Every Day Smoker -- 0.2 packs/day for 7 years    Types: Cigarettes  . Smokeless tobacco: Never Used  . Alcohol Use: No    Allergies:  Allergies  Allergen Reactions  . Sulfa Drugs Cross Reactors Hives    Prescriptions prior to admission  Medication Sig Dispense Refill  . calcium carbonate (TUMS - DOSED IN MG ELEMENTAL CALCIUM) 500 MG chewable tablet Chew 1-2 tablets by mouth as needed. Heartburn      . Docosahexaenoic Acid (DHA COMPLETE PO) Take 1 tablet by mouth daily.      . Prenatal Vit-Fe Fumarate-FA (PRENATAL MULTIVITAMIN) TABS Take 1 tablet by mouth daily.        Review of Systems  Constitutional: Negative for fever and chills.  Eyes: Negative for blurred vision.  Respiratory: Negative for cough.   Gastrointestinal: Negative for nausea, vomiting, abdominal pain, diarrhea and constipation.  Genitourinary: Negative for dysuria and urgency.  Musculoskeletal: Negative for myalgias.  Neurological: Negative for headaches.   Physical Exam   Blood  pressure 120/72, pulse 79, temperature 98.7 F (37.1 C), temperature source Oral, resp. rate 16, height 4\' 11"  (1.499 m), weight 72.303 kg (159 lb 6.4 oz), last menstrual period 11/04/2011, SpO2 100.00%.  Physical Exam  Nursing note and vitals reviewed. Constitutional: She is oriented to person, place, and time. She appears well-developed and well-nourished.  HENT:  Head: Normocephalic.  Cardiovascular: Normal rate and regular rhythm.   Respiratory: Effort normal and breath sounds normal.  GI: Soft.  Genitourinary:        NST: 140, moderate variablitiy, 15X15 accels. No decels. Toco: no UCs  Neurological: She is alert and oriented to person, place, and time.  Skin: Skin is warm and dry.    MAU Course  Procedures    Assessment and Plan   1. Breech presentation   2. Decreased fetal movement in pregnancy, antepartum    Instructions on fetal kick counts FU as scheduled at [facility] C-section scheduled for breech presentation  Tawnya Crook 07/24/2012, 4:56 PM

## 2012-07-25 NOTE — MAU Provider Note (Signed)
Attestation of Attending Supervision of Advanced Practitioner (CNM/NP): Evaluation and management procedures were performed by the Advanced Practitioner under my supervision and collaboration.  I have reviewed the Advanced Practitioner's note and chart, and I agree with the management and plan.  Tod Abrahamsen 07/25/2012 5:33 AM

## 2012-07-25 NOTE — Op Note (Signed)
External version: Patient was taken to labor and delivery room 172, external monitoring confirmed fetal well-being was category 1 monitoring ultrasound showed the baby to be in a breech presentation, with the vertex in the right upper quadrant with the fetal spine crossing the maternal midline. Ultrasound was used to confirm that Amniotic fluid volume was good and the placenta was posterior and fundal.. Consent was obtained and procedure confirmed by procedure team, and then terbutaline administered. The patient then had 3 good attempts at forward roll technique external cephalic version. The vertex was easily mobile and could be brought down into the right lower quadrant. The presenting part could be elevated such that the buttocks were in the lower quadrant but the feet remained below the head. Multiple efforts to jostle baby, with the patient supine, rotated to the left and with numerous position changes  did not result in completion of the version attempt. Maternal tolerance of the procedure was good. After approximately 15 minutes of time attempting these procedures, the patient agreed that reasonable effort had been performed and procedure unsuccessful. External monitoring was then repeated and fetal well-being once again confirmed. The patient was nontender. Blood type Rh+ confirmed. Patient allowed to go home after completion of post procedure nonstress test and scheduled for cesarean section in 2 weeks at [redacted] weeks gestation followup appointment 1 week [facility].

## 2012-08-01 ENCOUNTER — Encounter (HOSPITAL_COMMUNITY): Payer: Self-pay | Admitting: Obstetrics and Gynecology

## 2012-08-01 ENCOUNTER — Encounter (HOSPITAL_COMMUNITY)
Admission: RE | Admit: 2012-08-01 | Discharge: 2012-08-01 | Disposition: A | Discharge status: Home / Self Care | Payer: Medicaid Other | Source: Ambulatory Visit | Attending: Obstetrics and Gynecology | Admitting: Obstetrics and Gynecology

## 2012-08-01 ENCOUNTER — Encounter (HOSPITAL_COMMUNITY): Payer: Self-pay

## 2012-08-01 VITALS — BP 124/80 | Ht 59.0 in | Wt 161.0 lb

## 2012-08-01 DIAGNOSIS — O321XX0 Maternal care for breech presentation, not applicable or unspecified: Secondary | ICD-10-CM

## 2012-08-01 LAB — CBC
HCT: 34.5 % — ABNORMAL LOW (ref 36.0–46.0)
MCH: 29.4 pg (ref 26.0–34.0)
MCV: 88.2 fL (ref 78.0–100.0)
Platelets: 266 10*3/uL (ref 150–400)
RBC: 3.91 MIL/uL (ref 3.87–5.11)

## 2012-08-01 NOTE — H&P (Signed)
[name] is a 21 y.o. female presenting for urinary cesarean section at [redacted] weeks gestation for breech presentation. She's had external cephalic version attempted at [redacted] weeks gestation but it was unsuccessful it is felt unlikely that a repeat effort would be more successful. Risks of procedure been reviewed with patient and she desires to proceed at this time. Prenatal course of intrauterine uncomplicated course at [facility] 13 prenatal visits with appropriate weight gain and fundal height. An ultrasound recently confirmed 1028 it is indeed still in breech presentation. Maternal Medical History:  Fetal activity: Perceived fetal activity is normal.      OB History    Grav Para Term Preterm Abortions TAB SAB Ect Mult Living   1 0 0 0 0 0 0 0 0 0      Past Medical History  Diagnosis Date  . HPV (human papilloma virus) infection   . Irritable bowel syndrome (IBS)   . Pregnant   . Abnormal Pap smear   . No pertinent past medical history    Past Surgical History  Procedure Date  . Wisdom tooth extraction   . Colonoscopy    Family History: family history includes Cancer in her maternal grandfather; Diabetes in her paternal grandmother; Heart disease in her maternal grandmother; and Miscarriages / Stillbirths in her mother.  There is no history of Other. Social History:  reports that she has been smoking Cigarettes.  She has a 1.75 pack-year smoking history. She has never used smokeless tobacco. She reports that she does not drink alcohol or use illicit drugs.   Prenatal Transfer Tool  Maternal Diabetes: No Genetic Screening: Normal Maternal Ultrasounds/Referrals: Normal Fetal Ultrasounds or other Referrals:  None Maternal Substance Abuse:  No Significant Maternal Medications:  None Significant Maternal Lab Results:  None Other Comments:  None  Review of Systems  Constitutional: Negative.   Genitourinary: Negative.   Skin: Negative.    height 411" her weight 160 which is a  40 pound weight gain and fundal height 40 cm fundal height vertex in the right upper quadrant placenta is posterior fundal amniotic fluid is normal on ultrasound cervix is long and closed at last examination Dilation: Closed Station: -2 Last menstrual period 11/04/2011. Exam Physical Exam  Prenatal labs: ABO, Rh: O/Positive/-- (04/03 0000) Antibody: Negative (04/03 0000) Rubella: Immune (04/03 0000) RPR: NON REACTIVE (10/29 1056)  HBsAg: Negative (04/03 0000)  HIV: Non-reactive (04/03 0000)  GBS:   negative  Assessment/Plan: Pregnancy [redacted] weeks gestation breech presentation for primary cesarean section 08/03/2012. Procedure reviewed with patient Kathren and her husband Harrold Donath who will be her support person    Tilda Burrow 08/01/2012, 5:32 PM

## 2012-08-01 NOTE — Patient Instructions (Signed)
Your procedure is scheduled on:08/03/12  Enter through the Main Entrance at :0745 Pick up desk phone and dial [phone] and inform us of your arrival.  Please call [phone] if you have any problems the morning of surgery.  Remember: Do not eat or drink after midnight:Wed   Take these meds the morning of surgery with a sip of water:none  DO NOT wear jewelry, eye make-up, lipstick,body lotion, or dark fingernail polish. Do not shave for 48 hours prior to surgery.  If you are to be admitted after surgery, leave suitcase in car until your room has been assigned. Patients discharged on the day of surgery will not be allowed to drive home.

## 2012-08-03 ENCOUNTER — Encounter (HOSPITAL_COMMUNITY): Payer: Self-pay | Admitting: *Deleted

## 2012-08-03 ENCOUNTER — Encounter (HOSPITAL_COMMUNITY)
Admission: RE | Disposition: A | Discharge status: Home / Self Care | Payer: Self-pay | Source: Ambulatory Visit | Attending: Obstetrics and Gynecology

## 2012-08-03 ENCOUNTER — Encounter (HOSPITAL_COMMUNITY): Payer: Self-pay | Admitting: Anesthesiology

## 2012-08-03 ENCOUNTER — Inpatient Hospital Stay (HOSPITAL_COMMUNITY)
Admission: RE | Admit: 2012-08-03 | Discharge: 2012-08-05 | DRG: 766 | Disposition: A | Discharge status: Home / Self Care | Payer: Medicaid Other | Source: Ambulatory Visit | Attending: Obstetrics and Gynecology | Admitting: Obstetrics and Gynecology

## 2012-08-03 ENCOUNTER — Inpatient Hospital Stay (HOSPITAL_COMMUNITY): Payer: Medicaid Other | Admitting: Anesthesiology

## 2012-08-03 DIAGNOSIS — Z01812 Encounter for preprocedural laboratory examination: Secondary | ICD-10-CM

## 2012-08-03 DIAGNOSIS — O321XX0 Maternal care for breech presentation, not applicable or unspecified: Secondary | ICD-10-CM

## 2012-08-03 SURGERY — Surgical Case
Anesthesia: Spinal | Site: Abdomen | Wound class: Clean Contaminated

## 2012-08-03 MED ORDER — LACTATED RINGERS IV SOLN
INTRAVENOUS | Status: DC | PRN
  Administered 2012-08-03 (×2): via INTRAVENOUS

## 2012-08-03 MED ORDER — PRENATAL MULTIVITAMIN CH
1.0000 | ORAL_TABLET | Freq: Every day | ORAL | Status: DC
  Administered 2012-08-04 – 2012-08-05 (×2): 1 via ORAL
  Filled 2012-08-03 (×2): qty 1

## 2012-08-03 MED ORDER — BUPIVACAINE IN DEXTROSE 0.75-8.25 % IT SOLN
INTRATHECAL | Status: DC | PRN
  Administered 2012-08-03: 1.2 mL via INTRATHECAL

## 2012-08-03 MED ORDER — ONDANSETRON HCL 4 MG/2ML IJ SOLN
INTRAMUSCULAR | Status: AC
  Filled 2012-08-03: qty 2

## 2012-08-03 MED ORDER — DIPHENHYDRAMINE HCL 25 MG PO CAPS: 25.0000 mg | ORAL_CAPSULE | ORAL | Status: DC | PRN

## 2012-08-03 MED ORDER — ZOLPIDEM TARTRATE 5 MG PO TABS: 5.0000 mg | ORAL_TABLET | Freq: Every evening | ORAL | Status: DC | PRN

## 2012-08-03 MED ORDER — MORPHINE SULFATE 0.5 MG/ML IJ SOLN
INTRAMUSCULAR | Status: AC
  Filled 2012-08-03: qty 10

## 2012-08-03 MED ORDER — DIPHENHYDRAMINE HCL 25 MG PO CAPS: 25.0000 mg | ORAL_CAPSULE | Freq: Four times a day (QID) | ORAL | Status: DC | PRN

## 2012-08-03 MED ORDER — IBUPROFEN 600 MG PO TABS
600.0000 mg | ORAL_TABLET | Freq: Four times a day (QID) | ORAL | Status: DC
  Administered 2012-08-03 – 2012-08-05 (×7): 600 mg via ORAL
  Filled 2012-08-03 (×4): qty 1

## 2012-08-03 MED ORDER — SCOPOLAMINE 1 MG/3DAYS TD PT72
MEDICATED_PATCH | TRANSDERMAL | Status: AC
  Administered 2012-08-03: 1.5 mg via TRANSDERMAL
  Filled 2012-08-03: qty 1

## 2012-08-03 MED ORDER — IBUPROFEN 600 MG PO TABS
600.0000 mg | ORAL_TABLET | Freq: Four times a day (QID) | ORAL | Status: DC | PRN
  Filled 2012-08-03 (×3): qty 1

## 2012-08-03 MED ORDER — MORPHINE SULFATE (PF) 0.5 MG/ML IJ SOLN
INTRAMUSCULAR | Status: DC | PRN
  Administered 2012-08-03: .1 mg via INTRATHECAL

## 2012-08-03 MED ORDER — FENTANYL CITRATE 0.05 MG/ML IJ SOLN
INTRAMUSCULAR | Status: AC
  Filled 2012-08-03: qty 2

## 2012-08-03 MED ORDER — ONDANSETRON HCL 4 MG/2ML IJ SOLN: 4.0000 mg | Freq: Three times a day (TID) | INTRAMUSCULAR | Status: DC | PRN

## 2012-08-03 MED ORDER — SIMETHICONE 80 MG PO CHEW: 80.0000 mg | CHEWABLE_TABLET | ORAL | Status: DC | PRN

## 2012-08-03 MED ORDER — OXYTOCIN 40 UNITS IN LACTATED RINGERS INFUSION - SIMPLE MED: 62.5000 mL/h | INTRAVENOUS | Status: AC

## 2012-08-03 MED ORDER — OXYCODONE-ACETAMINOPHEN 5-325 MG PO TABS
1.0000 | ORAL_TABLET | ORAL | Status: DC | PRN
  Administered 2012-08-03 – 2012-08-05 (×10): 1 via ORAL
  Filled 2012-08-03 (×11): qty 1

## 2012-08-03 MED ORDER — HYDROMORPHONE HCL PF 1 MG/ML IJ SOLN
0.5000 mg | INTRAMUSCULAR | Status: AC
  Administered 2012-08-03: 0.5 mg via INTRAVENOUS
  Filled 2012-08-03: qty 1

## 2012-08-03 MED ORDER — DIBUCAINE 1 % RE OINT: 1.0000 "application " | TOPICAL_OINTMENT | RECTAL | Status: DC | PRN

## 2012-08-03 MED ORDER — SCOPOLAMINE 1 MG/3DAYS TD PT72
1.0000 | MEDICATED_PATCH | Freq: Once | TRANSDERMAL | Status: DC
  Filled 2012-08-03: qty 1

## 2012-08-03 MED ORDER — SCOPOLAMINE 1 MG/3DAYS TD PT72
1.0000 | MEDICATED_PATCH | Freq: Once | TRANSDERMAL | Status: DC
  Administered 2012-08-03: 1.5 mg via TRANSDERMAL

## 2012-08-03 MED ORDER — LACTATED RINGERS IV SOLN
INTRAVENOUS | Status: DC
  Administered 2012-08-03 (×3): via INTRAVENOUS

## 2012-08-03 MED ORDER — KETOROLAC TROMETHAMINE 60 MG/2ML IM SOLN
60.0000 mg | Freq: Once | INTRAMUSCULAR | Status: AC | PRN
  Filled 2012-08-03: qty 2

## 2012-08-03 MED ORDER — DIPHENHYDRAMINE HCL 50 MG/ML IJ SOLN: 25.0000 mg | INTRAMUSCULAR | Status: DC | PRN

## 2012-08-03 MED ORDER — ONDANSETRON HCL 4 MG/2ML IJ SOLN: 4.0000 mg | INTRAMUSCULAR | Status: DC | PRN

## 2012-08-03 MED ORDER — MEPERIDINE HCL 25 MG/ML IJ SOLN: 6.2500 mg | INTRAMUSCULAR | Status: DC | PRN

## 2012-08-03 MED ORDER — KETOROLAC TROMETHAMINE 30 MG/ML IJ SOLN
INTRAMUSCULAR | Status: AC
  Administered 2012-08-03: 30 mg via INTRAMUSCULAR
  Filled 2012-08-03: qty 1

## 2012-08-03 MED ORDER — LACTATED RINGERS IV SOLN
INTRAVENOUS | Status: DC
  Administered 2012-08-03: 18:00:00 via INTRAVENOUS

## 2012-08-03 MED ORDER — MENTHOL 3 MG MT LOZG: 1.0000 | LOZENGE | OROMUCOSAL | Status: DC | PRN

## 2012-08-03 MED ORDER — SODIUM CHLORIDE 0.9 % IV SOLN
1.0000 ug/kg/h | INTRAVENOUS | Status: DC | PRN
  Filled 2012-08-03: qty 2.5

## 2012-08-03 MED ORDER — SODIUM CHLORIDE 0.9 % IJ SOLN: 3.0000 mL | INTRAMUSCULAR | Status: DC | PRN

## 2012-08-03 MED ORDER — NALBUPHINE HCL 10 MG/ML IJ SOLN
5.0000 mg | INTRAMUSCULAR | Status: DC | PRN
  Filled 2012-08-03 (×2): qty 1

## 2012-08-03 MED ORDER — FENTANYL CITRATE 0.05 MG/ML IJ SOLN
INTRAMUSCULAR | Status: DC | PRN
  Administered 2012-08-03: 15 ug via INTRATHECAL

## 2012-08-03 MED ORDER — CEFAZOLIN SODIUM-DEXTROSE 2-3 GM-% IV SOLR
2.0000 g | INTRAVENOUS | Status: AC
  Administered 2012-08-03: 2 g via INTRAVENOUS

## 2012-08-03 MED ORDER — ONDANSETRON HCL 4 MG PO TABS: 4.0000 mg | ORAL_TABLET | ORAL | Status: DC | PRN

## 2012-08-03 MED ORDER — METOCLOPRAMIDE HCL 5 MG/ML IJ SOLN: 10.0000 mg | Freq: Three times a day (TID) | INTRAMUSCULAR | Status: DC | PRN

## 2012-08-03 MED ORDER — SIMETHICONE 80 MG PO CHEW
80.0000 mg | CHEWABLE_TABLET | Freq: Three times a day (TID) | ORAL | Status: DC
  Administered 2012-08-03 – 2012-08-05 (×6): 80 mg via ORAL

## 2012-08-03 MED ORDER — KETOROLAC TROMETHAMINE 30 MG/ML IJ SOLN: 30.0000 mg | Freq: Four times a day (QID) | INTRAMUSCULAR | Status: AC | PRN

## 2012-08-03 MED ORDER — KETOROLAC TROMETHAMINE 30 MG/ML IJ SOLN
30.0000 mg | Freq: Four times a day (QID) | INTRAMUSCULAR | Status: AC | PRN
  Administered 2012-08-03: 30 mg via INTRAMUSCULAR

## 2012-08-03 MED ORDER — TETANUS-DIPHTH-ACELL PERTUSSIS 5-2.5-18.5 LF-MCG/0.5 IM SUSP
0.5000 mL | Freq: Once | INTRAMUSCULAR | Status: AC
  Administered 2012-08-04: 0.5 mL via INTRAMUSCULAR
  Filled 2012-08-03: qty 0.5

## 2012-08-03 MED ORDER — LANOLIN HYDROUS EX OINT: 1.0000 "application " | TOPICAL_OINTMENT | CUTANEOUS | Status: DC | PRN

## 2012-08-03 MED ORDER — CEFAZOLIN SODIUM-DEXTROSE 2-3 GM-% IV SOLR: 2.0000 g | INTRAVENOUS | Status: DC

## 2012-08-03 MED ORDER — OXYTOCIN 10 UNIT/ML IJ SOLN
40.0000 [IU] | INTRAVENOUS | Status: DC | PRN
  Administered 2012-08-03: 40 [IU] via INTRAVENOUS

## 2012-08-03 MED ORDER — OXYTOCIN 10 UNIT/ML IJ SOLN
INTRAMUSCULAR | Status: AC
  Filled 2012-08-03: qty 1

## 2012-08-03 MED ORDER — CEFAZOLIN SODIUM-DEXTROSE 2-3 GM-% IV SOLR
INTRAVENOUS | Status: AC
  Filled 2012-08-03: qty 50

## 2012-08-03 MED ORDER — NALOXONE HCL 0.4 MG/ML IJ SOLN: 0.4000 mg | INTRAMUSCULAR | Status: DC | PRN

## 2012-08-03 MED ORDER — FENTANYL CITRATE 0.05 MG/ML IJ SOLN
INTRAMUSCULAR | Status: DC | PRN
  Administered 2012-08-03: 25 ug via INTRAVENOUS
  Administered 2012-08-03: 50 ug via INTRAVENOUS

## 2012-08-03 MED ORDER — WITCH HAZEL-GLYCERIN EX PADS: 1.0000 "application " | MEDICATED_PAD | CUTANEOUS | Status: DC | PRN

## 2012-08-03 MED ORDER — NALBUPHINE HCL 10 MG/ML IJ SOLN
5.0000 mg | INTRAMUSCULAR | Status: DC | PRN
  Administered 2012-08-04: 10 mg via SUBCUTANEOUS
  Filled 2012-08-03: qty 1

## 2012-08-03 MED ORDER — SENNOSIDES-DOCUSATE SODIUM 8.6-50 MG PO TABS
2.0000 | ORAL_TABLET | Freq: Every day | ORAL | Status: DC
  Administered 2012-08-03 – 2012-08-04 (×2): 2 via ORAL

## 2012-08-03 MED ORDER — ONDANSETRON HCL 4 MG/2ML IJ SOLN
INTRAMUSCULAR | Status: DC | PRN
  Administered 2012-08-03: 4 mg via INTRAVENOUS

## 2012-08-03 MED ORDER — DIPHENHYDRAMINE HCL 50 MG/ML IJ SOLN
12.5000 mg | INTRAMUSCULAR | Status: DC | PRN
  Administered 2012-08-03: 12.5 mg via INTRAVENOUS
  Filled 2012-08-03: qty 1

## 2012-08-03 SURGICAL SUPPLY — 35 items
APL SKNCLS STERI-STRIP NONHPOA (GAUZE/BANDAGES/DRESSINGS) ×1
BENZOIN TINCTURE PRP APPL 2/3 (GAUZE/BANDAGES/DRESSINGS) ×1 IMPLANT
CLOTH BEACON ORANGE TIMEOUT ST (SAFETY) ×2 IMPLANT
DRAPE SURG 17X23 STRL (DRAPES) ×1 IMPLANT
DRESSING TELFA 8X3 (GAUZE/BANDAGES/DRESSINGS) ×1 IMPLANT
DRSG COVADERM 4X10 (GAUZE/BANDAGES/DRESSINGS) ×1 IMPLANT
DURAPREP 26ML APPLICATOR (WOUND CARE) ×2 IMPLANT
ELECT REM PT RETURN 9FT ADLT (ELECTROSURGICAL) ×2
ELECTRODE REM PT RTRN 9FT ADLT (ELECTROSURGICAL) ×1 IMPLANT
EXTRACTOR VACUUM KIWI (MISCELLANEOUS) IMPLANT
GLOVE BIO SURGEON ST LM GN SZ9 (GLOVE) ×2 IMPLANT
GLOVE BIOGEL PI IND STRL 9 (GLOVE) ×2 IMPLANT
GLOVE BIOGEL PI INDICATOR 9 (GLOVE) ×2
GOWN PREVENTION PLUS LG XLONG (DISPOSABLE) ×2 IMPLANT
GOWN PREVENTION PLUS XLARGE (GOWN DISPOSABLE) ×2 IMPLANT
GOWN STRL REIN 3XL LVL4 (GOWN DISPOSABLE) ×2 IMPLANT
NDL HYPO 25X5/8 SAFETYGLIDE (NEEDLE) IMPLANT
NEEDLE HYPO 25X5/8 SAFETYGLIDE (NEEDLE) IMPLANT
NS IRRIG 1000ML POUR BTL (IV SOLUTION) ×2 IMPLANT
PACK C SECTION WH (CUSTOM PROCEDURE TRAY) ×2 IMPLANT
PAD OB MATERNITY 4.3X12.25 (PERSONAL CARE ITEMS) ×1 IMPLANT
RETRACTOR WND ALEXIS 25 LRG (MISCELLANEOUS) IMPLANT
RTRCTR C-SECT PINK 25CM LRG (MISCELLANEOUS) IMPLANT
RTRCTR WOUND ALEXIS 25CM LRG (MISCELLANEOUS)
SLEEVE SCD COMPRESS KNEE MED (MISCELLANEOUS) IMPLANT
STRIP CLOSURE SKIN 1/2X4 (GAUZE/BANDAGES/DRESSINGS) ×1 IMPLANT
SUT CHROMIC 0 CTX 36 (SUTURE) ×4 IMPLANT
SUT VIC AB 0 CT1 27 (SUTURE) ×2
SUT VIC AB 0 CT1 27XBRD ANBCTR (SUTURE) ×1 IMPLANT
SUT VIC AB 2-0 CT1 27 (SUTURE) ×4
SUT VIC AB 2-0 CT1 TAPERPNT 27 (SUTURE) ×2 IMPLANT
SUT VIC AB 4-0 KS 27 (SUTURE) ×2 IMPLANT
SYR BULB IRRIGATION 50ML (SYRINGE) IMPLANT
TRAY FOLEY CATH 14FR (SET/KITS/TRAYS/PACK) ×2 IMPLANT
WATER STERILE IRR 1000ML POUR (IV SOLUTION) ×1 IMPLANT

## 2012-08-03 NOTE — Anesthesia Postprocedure Evaluation (Signed)
Anesthesia Post Note  Patient: [name]  Procedure(s) Performed: Procedure(s) (LRB): CESAREAN SECTION (N/A)  Anesthesia type: Spinal  Patient location: PACU  Post pain: Pain level controlled  Post assessment: Post-op Vital signs reviewed  Last Vitals:  Filed Vitals:   08/03/12 1145  BP: 120/62  Pulse: 76  Temp: 36.8 C  Resp: 20    Post vital signs: Reviewed  Level of consciousness: awake  Complications: No apparent anesthesia complications

## 2012-08-03 NOTE — Brief Op Note (Signed)
08/03/2012  10:54 AM  PATIENT:  [name]  21 y.o. female  PRE-OPERATIVE DIAGNOSIS:  Breech pregnancy 39 weeks,   POST-OPERATIVE DIAGNOSIS:  breech pregnancy 39 weeks  PROCEDURE:  Procedure(s) (LRB) with comments: CESAREAN SECTION (N/A)  SURGEON:  Surgeon(s) and Role:    * Tilda Burrow, MD - Primary  PHYSICIAN ASSISTANT: Rinaldo Cloud .ferry M.D.  ASSISTANTS:    ANESTHESIA:   spinal  EBL:  Total I/O In: 2000 [I.V.:2000] Out: 800 [Urine:300; Blood:500]  BLOOD ADMINISTERED:none  DRAINS: Urinary Catheter (Foley)   LOCAL MEDICATIONS USED:  NONE  SPECIMEN:  Source of Specimen:  Placenta to pathology  DISPOSITION OF SPECIMEN:  N/A  COUNTS:  YES  TOURNIQUET:  * No tourniquets in log *  DICTATION: .Dragon Dictation Patient was taken operating room prepped and draped for lower surgery with spinal anesthesia in place. Timeout was conducted. Ancef 2 g IV administered. A transverse lower incision was made in the method of Pfannenstiel, with midline opening of the peritoneal cavity. Bladder flap was not developed. Transverse uterine incision was made with #15 blade, extended transversely with index finger traction then cephalad and caudad traction, and the fetal buttocks delivered through the incision legs swept and flexed at the knee before delivery the body delivered to the shoulders the arms swept in front of the chest, and the vertex delivered with a finger in the mouth to maintain orientation and proper axis. Cord was clamped and the infant passed to waiting pediatricians. Placenta delivered easily membrane remnants removed, and 2 layer closure the uterus with 0 chromic performed the first layer running locking and the second continuous running.Marland Kitchen Anterior peritoneum was closed running 2-0 chromic, the fascia closed with running 0 Vicryl the subcutaneous tissues reapproximated with horizontal mattress 2-0 Vicryl and subcuticular 4-0 Vicryl closure the skin incision completed the  procedure EBL 500 cc sponge and needle counts correct PLAN OF CARE: Admit to inpatient   PATIENT DISPOSITION:  PACU - hemodynamically stable.   Delay start of Pharmacological VTE agent (>24hrs) due to surgical blood loss or risk of bleeding: not applicable

## 2012-08-03 NOTE — Interval H&P Note (Signed)
History and Physical Interval Note:  08/03/2012 7:57 AM  [name]  has presented today for surgery, with the diagnosis of breech  The various methods of treatment have been discussed with the patient and family. After consideration of risks, benefits and other options for treatment, the patient has consented to  Cesarean Section as a surgical intervention  .  The patient's history has been reviewed, patient examined, no change in status, stable for surgery.  I have reviewed the patient's chart and labs.  Questions were answered to the patient's satisfaction.     Tilda Burrow

## 2012-08-03 NOTE — Preoperative (Signed)
Beta Blockers   Reason not to administer Beta Blockers:Not Applicable 

## 2012-08-03 NOTE — Anesthesia Preprocedure Evaluation (Signed)
Anesthesia Evaluation  Patient identified by MRN, date of birth, ID band Patient awake    Reviewed: Allergy & Precautions, H&P , NPO status , Patient's Chart, lab work & pertinent test results, reviewed documented beta blocker date and time   History of Anesthesia Complications Negative for: history of anesthetic complications  Airway Mallampati: I TM Distance: >3 FB Neck ROM: full    Dental  (+) Teeth Intact   Pulmonary Current Smoker,  breath sounds clear to auscultation        Cardiovascular negative cardio ROS  Rhythm:regular Rate:Normal     Neuro/Psych negative neurological ROS  negative psych ROS   GI/Hepatic negative GI ROS, Neg liver ROS,   Endo/Other  negative endocrine ROS  Renal/GU negative Renal ROS  negative genitourinary   Musculoskeletal   Abdominal   Peds  Hematology negative hematology ROS (+)   Anesthesia Other Findings NOTE - pt is 4'11"  Reproductive/Obstetrics (+) Pregnancy (breech)                           Anesthesia Physical Anesthesia Plan  ASA: II  Anesthesia Plan: Spinal   Post-op Pain Management:    Induction:   Airway Management Planned:   Additional Equipment:   Intra-op Plan:   Post-operative Plan:   Informed Consent: I have reviewed the patients History and Physical, chart, labs and discussed the procedure including the risks, benefits and alternatives for the proposed anesthesia with the patient or authorized representative who has indicated his/her understanding and acceptance.     Plan Discussed with: Surgeon and CRNA  Anesthesia Plan Comments:         Anesthesia Quick Evaluation

## 2012-08-03 NOTE — Anesthesia Procedure Notes (Signed)
Spinal  Patient location during procedure: OR Start time: 08/03/2012 9:50 AM Staffing Performed by: anesthesiologist  Preanesthetic Checklist Completed: patient identified, site marked, surgical consent, pre-op evaluation, timeout performed, IV checked, risks and benefits discussed and monitors and equipment checked Spinal Block Patient position: sitting Prep: site prepped and draped and DuraPrep Patient monitoring: heart rate, cardiac monitor, continuous pulse ox and blood pressure Approach: midline Location: L3-4 Injection technique: single-shot Needle Needle type: Sprotte  Needle gauge: 24 G Needle length: 9 cm Assessment Sensory level: T4 Additional Notes Clear free flow CSF on first attempt.  No paresthesia.  Patient tolerated procedure well.  Jasmine December, MD

## 2012-08-03 NOTE — Op Note (Signed)
See operative note details included in the brief operative note 

## 2012-08-03 NOTE — Transfer of Care (Addendum)
Immediate Anesthesia Transfer of Care Note  Patient: [name]  Procedure(s) Performed: Procedure(s) (LRB) with comments: CESAREAN SECTION (N/A)  Patient Location: PACU  Anesthesia Type:Spinal  Level of Consciousness: awake, alert , oriented and patient cooperative  Airway & Oxygen Therapy: Patient Spontanous Breathing  Post-op Assessment: Report given to PACU RN and Post -op Vital signs reviewed and stable  Post vital signs: Reviewed and stable  Complications: No apparent anesthesia complications

## 2012-08-03 NOTE — Addendum Note (Signed)
Addendum  created 08/03/12 1536 by Dana Allan, MD   Modules edited:Orders

## 2012-08-04 ENCOUNTER — Encounter (HOSPITAL_COMMUNITY): Payer: Self-pay | Admitting: Obstetrics and Gynecology

## 2012-08-04 LAB — CBC
Hemoglobin: 10.1 g/dL — ABNORMAL LOW (ref 12.0–15.0)
MCH: 29.6 pg (ref 26.0–34.0)
RBC: 3.41 MIL/uL — ABNORMAL LOW (ref 3.87–5.11)
WBC: 11.1 10*3/uL — ABNORMAL HIGH (ref 4.0–10.5)

## 2012-08-04 LAB — BIRTH TISSUE RECOVERY COLLECTION (PLACENTA DONATION)

## 2012-08-04 NOTE — Progress Notes (Signed)
UR Chart review completed.  

## 2012-08-04 NOTE — Progress Notes (Signed)
Subjective: [name] reports tolerating PO, + flatus and no problems voiding.    Pt states she is doing well. Tolerating PO well. Up ad lib. + flatus. Reports minimal pain in lower abdomen. Well-controlled with current medication. Reports minimal bleeding. Did not need to change pad overnight. No significant incisional drainage. Denies headache, fever, chills, nausea, vomiting. Pt is breastfeeding. States this is going fairly well, but would like to speak with lactation consultant again today to discuss methods for preventing nipple cracking and helping baby to feed for longer periods of time. Pt has appointment on Monday 11/4 with Dr. Emelda Fear. Plans for OP circumcision of baby boy. Discussed birth control options. Pt not interested in any form of hormonal birth control due to personal and family history of difficulty trying to conceive.   Objective: Vital signs in last 24 hours: Temp:  [97.3 F (36.3 C)-98.3 F (36.8 C)] 98 F (36.7 C) (11/01 0546) Pulse Rate:  [31-107] 57  (11/01 0546) Resp:  [16-23] 18  (11/01 0546) BP: (95-133)/(53-79) 100/68 mmHg (11/01 0546) SpO2:  [96 %-100 %] 98 % (11/01 0220) Weight:  [73.029 kg (161 lb)] 73.029 kg (161 lb) (10/31 1258)  Physical Exam:  General: alert, cooperative and no distress Lochia: appropriate Uterine Fundus: firm Incision: healing well, no significant drainage, no significant erythema DVT Evaluation: No evidence of DVT seen on physical exam. Negative Homan's sign. No cords or calf tenderness. Mild bilateral pedal edema, relieved with lower extremity elevation   Basename 08/04/12 0550 08/01/12 1056  HGB 10.1* 11.5*  HCT 30.3* 34.5*    Assessment/Plan: Status post Cesarean section. Doing well postoperatively.  Continue current care.  [name] 08/04/2012, 8:41 AM  I examined pt and agree with documentation above and PA-S plan of care. [facility]

## 2012-08-05 MED ORDER — IBUPROFEN 600 MG PO TABS: 600.0000 mg | ORAL_TABLET | Freq: Four times a day (QID) | ORAL | Status: DC | PRN

## 2012-08-05 MED ORDER — OXYCODONE-ACETAMINOPHEN 5-325 MG PO TABS: 1.0000 | ORAL_TABLET | ORAL | Status: DC | PRN

## 2012-08-05 NOTE — Discharge Summary (Signed)
Obstetric Discharge Summary Reason for Admission: cesarean section- breech Prenatal Procedures: ultrasound Intrapartum Procedures: cesarean: low cervical, transverse Postpartum Procedures: none Complications-Operative and Postpartum: none Hemoglobin  Date Value Range Status  08/04/2012 10.1* 12.0 - 15.0 g/dL Final     HCT  Date Value Range Status  08/04/2012 30.3* 36.0 - 46.0 % Final    Physical Exam:  General: alert, cooperative and mild distress Lochia: appropriate Uterine Fundus: firm Incision: healing well, no significant drainage, no dehiscence; steri strips present and intact DVT Evaluation: No evidence of DVT seen on physical exam.  Discharge Diagnoses: Term Pregnancy-delivered  Discharge Information: Date: 08/05/2012 Activity: pelvic rest Diet: routine Medications: PNV, Ibuprofen and Percocet Condition: stable Instructions: refer to practice specific booklet Discharge to: home Follow-up Information    Follow up with [facility]. (Keep your postpartum visit as scheduled)    Contact information:   [address] [place] [address]          Newborn Data: Live born female  Birth Weight: 7 lb 6.5 oz (3360 g) APGAR: 8, 9  Home with mother. Breastfeeding going well. Declines contraception.  Cam Hai 08/05/2012, 7:25 AM

## 2013-04-11 ENCOUNTER — Ambulatory Visit (INDEPENDENT_AMBULATORY_CARE_PROVIDER_SITE_OTHER): Payer: BC Managed Care – PPO | Admitting: Adult Health

## 2013-04-11 ENCOUNTER — Encounter: Payer: Self-pay | Admitting: Adult Health

## 2013-04-11 VITALS — BP 98/58 | Ht 59.0 in | Wt 123.0 lb

## 2013-04-11 DIAGNOSIS — Z3201 Encounter for pregnancy test, result positive: Secondary | ICD-10-CM

## 2013-04-11 DIAGNOSIS — Z3202 Encounter for pregnancy test, result negative: Secondary | ICD-10-CM

## 2013-04-11 LAB — POCT URINE PREGNANCY: Preg Test, Ur: POSITIVE

## 2013-04-24 ENCOUNTER — Other Ambulatory Visit: Payer: Self-pay | Admitting: Obstetrics & Gynecology

## 2013-04-24 DIAGNOSIS — O3680X0 Pregnancy with inconclusive fetal viability, not applicable or unspecified: Secondary | ICD-10-CM

## 2013-04-25 ENCOUNTER — Ambulatory Visit (INDEPENDENT_AMBULATORY_CARE_PROVIDER_SITE_OTHER): Payer: BC Managed Care – PPO

## 2013-04-25 DIAGNOSIS — O34219 Maternal care for unspecified type scar from previous cesarean delivery: Secondary | ICD-10-CM

## 2013-04-25 DIAGNOSIS — O3680X0 Pregnancy with inconclusive fetal viability, not applicable or unspecified: Secondary | ICD-10-CM

## 2013-04-25 NOTE — Progress Notes (Signed)
U/S(6+1wks)-single IUP with +FCA noted, CRL c/w LMP dates, cx long and closed, bilateral adnexa wnl

## 2013-05-01 ENCOUNTER — Other Ambulatory Visit (HOSPITAL_COMMUNITY)
Admission: RE | Admit: 2013-05-01 | Discharge: 2013-05-01 | Disposition: A | Discharge status: Home / Self Care | Payer: Medicaid Other | Source: Ambulatory Visit | Attending: Obstetrics and Gynecology | Admitting: Obstetrics and Gynecology

## 2013-05-01 ENCOUNTER — Encounter: Payer: Self-pay | Admitting: Women's Health

## 2013-05-01 ENCOUNTER — Ambulatory Visit (INDEPENDENT_AMBULATORY_CARE_PROVIDER_SITE_OTHER): Payer: BC Managed Care – PPO | Admitting: Women's Health

## 2013-05-01 VITALS — BP 98/50 | Wt 123.6 lb

## 2013-05-01 DIAGNOSIS — O210 Mild hyperemesis gravidarum: Secondary | ICD-10-CM

## 2013-05-01 DIAGNOSIS — Z3480 Encounter for supervision of other normal pregnancy, unspecified trimester: Secondary | ICD-10-CM | POA: Insufficient documentation

## 2013-05-01 DIAGNOSIS — Z01419 Encounter for gynecological examination (general) (routine) without abnormal findings: Secondary | ICD-10-CM | POA: Insufficient documentation

## 2013-05-01 DIAGNOSIS — O219 Vomiting of pregnancy, unspecified: Secondary | ICD-10-CM

## 2013-05-01 DIAGNOSIS — Z3481 Encounter for supervision of other normal pregnancy, first trimester: Secondary | ICD-10-CM

## 2013-05-01 DIAGNOSIS — O34219 Maternal care for unspecified type scar from previous cesarean delivery: Secondary | ICD-10-CM

## 2013-05-01 DIAGNOSIS — F17200 Nicotine dependence, unspecified, uncomplicated: Secondary | ICD-10-CM

## 2013-05-01 DIAGNOSIS — Z98891 History of uterine scar from previous surgery: Secondary | ICD-10-CM | POA: Insufficient documentation

## 2013-05-01 DIAGNOSIS — O09891 Supervision of other high risk pregnancies, first trimester: Secondary | ICD-10-CM

## 2013-05-01 DIAGNOSIS — O09899 Supervision of other high risk pregnancies, unspecified trimester: Secondary | ICD-10-CM | POA: Insufficient documentation

## 2013-05-01 DIAGNOSIS — L731 Pseudofolliculitis barbae: Secondary | ICD-10-CM

## 2013-05-01 LAB — URINALYSIS, ROUTINE W REFLEX MICROSCOPIC
Bilirubin Urine: NEGATIVE
Glucose, UA: NEGATIVE mg/dL
Ketones, ur: NEGATIVE mg/dL
Protein, ur: NEGATIVE mg/dL
Urobilinogen, UA: 0.2 mg/dL (ref 0.0–1.0)

## 2013-05-01 LAB — RPR

## 2013-05-01 LAB — CBC
Platelets: 264 10*3/uL (ref 150–400)
RBC: 4.18 MIL/uL (ref 3.87–5.11)
WBC: 5.8 10*3/uL (ref 4.0–10.5)

## 2013-05-01 LAB — HIV ANTIBODY (ROUTINE TESTING W REFLEX): HIV: NONREACTIVE

## 2013-05-01 MED ORDER — DOXYLAMINE-PYRIDOXINE 10-10 MG PO TBEC: 10.0000 mg | DELAYED_RELEASE_TABLET | ORAL | Status: DC

## 2013-05-01 MED ORDER — CEPHALEXIN 500 MG PO CAPS: 500.0000 mg | ORAL_CAPSULE | Freq: Four times a day (QID) | ORAL | Status: DC

## 2013-05-01 NOTE — Progress Notes (Signed)
Subjective:    [name] is a 22 y.o. G104P1001 Caucasian female at [redacted]w[redacted]d by LMP which correlates exactly w/ 6.1wk u/s, being seen today for her first obstetrical visit.  Her obstetrical history is significant for prev c/s for breech, short interval pregnancy- last birth 08/03/12, smoker.  Pregnancy history fully reviewed.   Patient reports nausea, only vomited x 1, ingrown hair x 30yrs, no cramping, vb, or uti s/s.   Filed Vitals:   05/01/13 0925  BP: 98/50  Weight: 123 lb 9.6 oz (56.065 kg)    HISTORY: OB History   Grav Para Term Preterm Abortions TAB SAB Ect Mult Living   2 1 1  0 0 0 0 0 0 1     # Outc Date GA Lbr Len/2nd Wgt Sex Del Anes PTL Lv   1 TRM 10/13 [redacted]w[redacted]d 00:00 7lb6.5oz(3.36kg) M LTCS Spinal  Yes   2 CUR              Past Medical History  Diagnosis Date  . HPV (human papilloma virus) infection   . Irritable bowel syndrome (IBS)   . Pregnant   . Abnormal Pap smear   . No pertinent past medical history    Past Surgical History  Procedure Laterality Date  . Wisdom tooth extraction    . Colonoscopy    . Cesarean section  08/03/2012    Procedure: CESAREAN SECTION;  Surgeon: Tilda Burrow, MD;  Location: [facility];  Service: Obstetrics;  Laterality: N/A;   Family History  Problem Relation Age of Onset  . Other Neg Hx   . Miscarriages / [place] Mother   . Heart disease Maternal Grandmother   . Cancer Maternal Grandfather     prostate  . Diabetes Paternal Grandmother      Exam    Pelvic Exam:    Perineum: Normal Perineum   Vulva: Erythematous tender area Lt labia majora- ingrown hair per pt, co-exam w/ JAG   Vagina:  normal mucosa, normal discharge, no palpable nodules   Uterus   normal size/shape for 7wks     Cervix: normal   Adnexa: Not palpable   Urinary: urethral meatus normal    System:     Skin: normal coloration and turgor, no rashes    Neurologic: oriented, normal mood   Extremities: normal strength, tone, and muscle mass   HEENT  PERRLA   Mouth/Teeth mucous membranes moist   Cardiovascular: regular rate and rhythm   Respiratory:  appears well, vitals normal, no respiratory distress, acyanotic, normal RR   Abdomen: soft, non-tender    Thin prep pap smear obtained FHR 130 via informal transabdominal u/s   Assessment:    Pregnancy: G2P1001 Patient Active Problem List   Diagnosis Date Noted  . Supervision of other normal pregnancy 05/01/2013    Priority: High  . Short interval between pregnancies complicating pregnancy, antepartum 05/01/2013    Priority: High      [redacted]w[redacted]d G2P1001 New OB visit Nausea of pregnancy Prev c/s for breech Short interval pregnancy Smoker Ingrown hair   Plan:     Initial labs drawn Continue prenatal vitamins Problem list reviewed and updated Reviewed n/v relief measures and warning s/s to report Reviewed recommended weight gain based on pre-gravid BMI Encouraged well-balanced diet Genetic Screening discussed Integrated Screen: requested Cystic fibrosis screening discussed neg last preg Ultrasound discussed; fetal survey: requested Follow up in 5wks weeks for 1st IT/NT and visit Rx keflex QID x 10d for ingrown hair Rx diclegis and samples given  Marge Duncans 05/01/2013 9:57 AM

## 2013-05-01 NOTE — Progress Notes (Signed)
Pt states "old c-sections scar hurts internally"

## 2013-05-01 NOTE — Patient Instructions (Addendum)
Nausea & Vomiting  Have saltine crackers or pretzels by your bed and eat a few bites before you raise your head out of bed in the morning  Eat small frequent meals throughout the day instead of large meals  Drink plenty of fluids throughout the day to stay hydrated, just don't drink a lot of fluids with your meals.  This can make your stomach fill up faster making you feel sick  Do not brush your teeth right after you eat  Products with real ginger are good for nausea, like ginger ale and ginger hard candy Make sure it says made with real ginger!  Sucking on sour candy like lemon heads is also good for nausea  If your prenatal vitamins make you nauseated, take them at night so you will sleep through the nausea  If you feel like you need medicine for the nausea & vomiting please let us know  If you are unable to keep any fluids or food down please let us know    Pregnancy - First Trimester During sexual intercourse, millions of sperm go into the vagina. Only 1 sperm will penetrate and fertilize the female egg while it is in the Fallopian tube. One week later, the fertilized egg implants into the wall of the uterus. An embryo begins to develop into a baby. At 6 to 8 weeks, the eyes and face are formed and the heartbeat can be seen on ultrasound. At the end of 12 weeks (first trimester), all the baby's organs are formed. Now that you are pregnant, you will want to do everything you can to have a healthy baby. Two of the most important things are to get good prenatal care and follow your caregiver's instructions. Prenatal care is all the medical care you receive before the baby's birth. It is given to prevent, find, and treat problems during the pregnancy and childbirth. PRENATAL EXAMS  During prenatal visits, your weight, blood pressure, and urine are checked. This is done to make sure you are healthy and progressing normally during the pregnancy.  A pregnant woman should gain 25 to 35 pounds  during the pregnancy. However, if you are overweight or underweight, your caregiver will advise you regarding your weight.  Your caregiver will ask and answer questions for you.  Blood work, cervical cultures, other necessary tests, and a Pap test are done during your prenatal exams. These tests are done to check on your health and the probable health of your baby. Tests are strongly recommended and done for HIV with your permission. This is the virus that causes AIDS. These tests are done because medicines can be given to help prevent your baby from being born with this infection should you have been infected without knowing it. Blood work is also used to find out your blood type, previous infections, and follow your blood levels (hemoglobin).  Low hemoglobin (anemia) is common during pregnancy. Iron and vitamins are given to help prevent this. Later in the pregnancy, blood tests for diabetes will be done along with any other tests if any problems develop.  You may need other tests to make sure you and the baby are doing well. CHANGES DURING THE FIRST TRIMESTER  Your body goes through many changes during pregnancy. They vary from person to person. Talk to your caregiver about changes you notice and are concerned about. Changes can include:  Your menstrual period stops.  The egg and sperm carry the genes that determine what you look like. Genes from you   and your partner are forming a baby. The female genes determine whether the baby is a boy or a girl.  Your body increases in girth and you may feel bloated.  Feeling sick to your stomach (nauseous) and throwing up (vomiting). If the vomiting is uncontrollable, call your caregiver.  Your breasts will begin to enlarge and become tender.  Your nipples may stick out more and become darker.  The need to urinate more. Painful urination may mean you have a bladder infection.  Tiring easily.  Loss of appetite.  Cravings for certain kinds of  food.  At first, you may gain or lose a couple of pounds.  You may have changes in your emotions from day to day (excited to be pregnant or concerned something may go wrong with the pregnancy and baby).  You may have more vivid and strange dreams. HOME CARE INSTRUCTIONS   It is very important to avoid all smoking, alcohol and non-prescribed drugs during your pregnancy. These affect the formation and growth of the baby. Avoid chemicals while pregnant to ensure the delivery of a healthy infant.  Start your prenatal visits by the 12th week of pregnancy. They are usually scheduled monthly at first, then more often in the last 2 months before delivery. Keep your caregiver's appointments. Follow your caregiver's instructions regarding medicine use, blood and lab tests, exercise, and diet.  During pregnancy, you are providing food for you and your baby. Eat regular, well-balanced meals. Choose foods such as meat, fish, milk and other low fat dairy products, vegetables, fruits, and whole-grain breads and cereals. Your caregiver will tell you of the ideal weight gain.  You can help morning sickness by keeping soda crackers at the bedside. Eat a couple before arising in the morning. You may want to use the crackers without salt on them.  Eating 4 to 5 small meals rather than 3 large meals a day also may help the nausea and vomiting.  Drinking liquids between meals instead of during meals also seems to help nausea and vomiting.  A physical sexual relationship may be continued throughout pregnancy if there are no other problems. Problems may be early (premature) leaking of amniotic fluid from the membranes, vaginal bleeding, or belly (abdominal) pain.  Exercise regularly if there are no restrictions. Check with your caregiver or physical therapist if you are unsure of the safety of some of your exercises. Greater weight gain will occur in the last 2 trimesters of pregnancy. Exercising will  help:  Control your weight.  Keep you in shape.  Prepare you for labor and delivery.  Help you lose your pregnancy weight after you deliver your baby.  Wear a good support or jogging bra for breast tenderness during pregnancy. This may help if worn during sleep too.  Ask when prenatal classes are available. Begin classes when they are offered.  Do not use hot tubs, steam rooms, or saunas.  Wear your seat belt when driving. This protects you and your baby if you are in an accident.  Avoid raw meat, uncooked cheese, cat litter boxes, and soil used by cats throughout the pregnancy. These carry germs that can cause birth defects in the baby.  The first trimester is a good time to visit your dentist for your dental health. Getting your teeth cleaned is okay. Use a softer toothbrush and brush gently during pregnancy.  Ask for help if you have financial, counseling, or nutritional needs during pregnancy. Your caregiver will be able to offer counseling for   these needs as well as refer you for other special needs.  Do not take any medicines or herbs unless told by your caregiver.  Inform your caregiver if there is any mental or physical domestic violence.  Make a list of emergency phone numbers of family, friends, hospital, and police and fire departments.  Write down your questions. Take them to your prenatal visit.  Do not douche.  Do not cross your legs.  If you have to stand for long periods of time, rotate you feet or take small steps in a circle.  You may have more vaginal secretions that may require a sanitary pad. Do not use tampons or scented sanitary pads. MEDICINES AND DRUG USE IN PREGNANCY  Take prenatal vitamins as directed. The vitamin should contain 1 milligram of folic acid. Keep all vitamins out of reach of children. Only a couple vitamins or tablets containing iron may be fatal to a baby or young child when ingested.  Avoid use of all medicines, including herbs,  over-the-counter medicines, not prescribed or suggested by your caregiver. Only take over-the-counter or prescription medicines for pain, discomfort, or fever as directed by your caregiver. Do not use aspirin, ibuprofen, or naproxen unless directed by your caregiver.  Let your caregiver also know about herbs you may be using.  Alcohol is related to a number of birth defects. This includes fetal alcohol syndrome. All alcohol, in any form, should be avoided completely. Smoking will cause low birth rate and premature babies.  Street or illegal drugs are very harmful to the baby. They are absolutely forbidden. A baby born to an addicted mother will be addicted at birth. The baby will go through the same withdrawal an adult does.  Let your caregiver know about any medicines that you have to take and for what reason you take them. SEEK MEDICAL CARE IF:  You have any concerns or worries during your pregnancy. It is better to call with your questions if you feel they cannot wait, rather than worry about them. SEEK IMMEDIATE MEDICAL CARE IF:   An unexplained oral temperature above 102 F (38.9 C) develops, or as your caregiver suggests.  You have leaking of fluid from the vagina (birth canal). If leaking membranes are suspected, take your temperature and inform your caregiver of this when you call.  There is vaginal spotting or bleeding. Notify your caregiver of the amount and how many pads are used.  You develop a bad smelling vaginal discharge with a change in the color.  You continue to feel sick to your stomach (nauseated) and have no relief from remedies suggested. You vomit blood or coffee ground-like materials.  You lose more than 2 pounds of weight in 1 week.  You gain more than 2 pounds of weight in 1 week and you notice swelling of your face, hands, feet, or legs.  You gain 5 pounds or more in 1 week (even if you do not have swelling of your hands, face, legs, or feet).  You get  exposed to German measles and have never had them.  You are exposed to fifth disease or chickenpox.  You develop belly (abdominal) pain. Round ligament discomfort is a common non-cancerous (benign) cause of abdominal pain in pregnancy. Your caregiver still must evaluate this.  You develop headache, fever, diarrhea, pain with urination, or shortness of breath.  You fall or are in a car accident or have any kind of trauma.  There is mental or physical violence in your home. Document   Released: 09/14/2001 Document Revised: 06/14/2012 Document Reviewed: 03/18/2009 [employer] Patient Information 2014 [employer].  

## 2013-05-01 NOTE — Addendum Note (Signed)
Addended by: Criss Alvine on: 05/01/2013 10:33 AM   Modules accepted: Orders

## 2013-05-02 ENCOUNTER — Encounter: Payer: Self-pay | Admitting: Women's Health

## 2013-05-02 DIAGNOSIS — Z283 Underimmunization status: Secondary | ICD-10-CM | POA: Insufficient documentation

## 2013-05-02 DIAGNOSIS — O09899 Supervision of other high risk pregnancies, unspecified trimester: Secondary | ICD-10-CM

## 2013-05-02 LAB — DRUG SCREEN, URINE, NO CONFIRMATION
Amphetamine Screen, Ur: NEGATIVE
Marijuana Metabolite: NEGATIVE
Methadone: NEGATIVE
Opiate Screen, Urine: NEGATIVE
Propoxyphene: NEGATIVE

## 2013-05-02 LAB — VARICELLA ZOSTER ANTIBODY, IGG: Varicella IgG: <10 Index (ref <135.00)

## 2013-05-02 LAB — GC/CHLAMYDIA PROBE AMP
CT Probe RNA: NEGATIVE
GC Probe RNA: NEGATIVE

## 2013-05-02 LAB — ABO AND RH

## 2013-05-02 LAB — OXYCODONE SCREEN, UA, RFLX CONFIRM: Oxycodone Screen, Ur: NEGATIVE ng/mL

## 2013-05-02 LAB — RUBELLA SCREEN: Rubella: 2.09 Index — ABNORMAL HIGH (ref <0.90)

## 2013-05-03 ENCOUNTER — Encounter: Payer: Self-pay | Admitting: Women's Health

## 2013-05-03 LAB — URINE CULTURE: Colony Count: NO GROWTH

## 2013-06-05 ENCOUNTER — Other Ambulatory Visit: Payer: BC Managed Care – PPO

## 2013-06-13 ENCOUNTER — Encounter: Payer: BC Managed Care – PPO | Admitting: Advanced Practice Midwife

## 2013-06-13 ENCOUNTER — Other Ambulatory Visit: Payer: BC Managed Care – PPO

## 2013-06-20 ENCOUNTER — Ambulatory Visit (INDEPENDENT_AMBULATORY_CARE_PROVIDER_SITE_OTHER): Payer: BC Managed Care – PPO | Admitting: Advanced Practice Midwife

## 2013-06-20 ENCOUNTER — Encounter: Payer: Self-pay | Admitting: Advanced Practice Midwife

## 2013-06-20 ENCOUNTER — Other Ambulatory Visit: Payer: Self-pay | Admitting: Women's Health

## 2013-06-20 ENCOUNTER — Encounter: Payer: BC Managed Care – PPO | Admitting: Advanced Practice Midwife

## 2013-06-20 ENCOUNTER — Ambulatory Visit (INDEPENDENT_AMBULATORY_CARE_PROVIDER_SITE_OTHER): Payer: BC Managed Care – PPO

## 2013-06-20 ENCOUNTER — Ambulatory Visit: Payer: BC Managed Care – PPO | Admitting: *Deleted

## 2013-06-20 VITALS — BP 90/58 | Wt 128.0 lb

## 2013-06-20 DIAGNOSIS — O34219 Maternal care for unspecified type scar from previous cesarean delivery: Secondary | ICD-10-CM

## 2013-06-20 DIAGNOSIS — Z1389 Encounter for screening for other disorder: Secondary | ICD-10-CM

## 2013-06-20 DIAGNOSIS — R002 Palpitations: Secondary | ICD-10-CM

## 2013-06-20 DIAGNOSIS — Z331 Pregnant state, incidental: Secondary | ICD-10-CM

## 2013-06-20 DIAGNOSIS — Z363 Encounter for antenatal screening for malformations: Secondary | ICD-10-CM

## 2013-06-20 DIAGNOSIS — Z3480 Encounter for supervision of other normal pregnancy, unspecified trimester: Secondary | ICD-10-CM

## 2013-06-20 LAB — POCT URINALYSIS DIPSTICK
Blood, UA: NEGATIVE
Glucose, UA: NEGATIVE
Nitrite, UA: NEGATIVE
Protein, UA: NEGATIVE

## 2013-06-20 NOTE — Progress Notes (Signed)
Pain and pressure at c section scar. Brown spotting last week x 1. None since then.

## 2013-06-20 NOTE — Progress Notes (Signed)
C/O palpitations QOD with SOB, near fainting for 3 weeks.  Event monitor ordered.  Had u/s today, but too late for NT.  Declines AFP.    No c/o at this time.  Routine questions about pregnancy answered.  F/U in 3 weeks for LROB.

## 2013-06-20 NOTE — Patient Instructions (Addendum)
Your physician has recommended that you wear an event monitor. Event monitors are medical devices that record the heart's electrical activity. Doctors most often [place] these monitors to diagnose arrhythmias. Arrhythmias are problems with the speed or rhythm of the heartbeat. The monitor is a small, portable device. You can wear one while you do your normal daily activities. This is usually used to diagnose what is causing palpitations/syncope (passing out).FOR SEVEN DAYS, YOUR PCP WILL CALL YOU WITHIN A WEEK FROM REMOVAL DATE 06-27-13

## 2013-06-20 NOTE — Progress Notes (Signed)
U/S(14+1wks)-pt is too far along in pregnancy for NT/IT screening, although active fetus, CRL c/w dates, NB present, NT-1.28mm appears WNL, cx long and closed, bilateral adnexa wnl

## 2013-06-21 ENCOUNTER — Telehealth: Payer: Self-pay | Admitting: *Deleted

## 2013-06-21 NOTE — Telephone Encounter (Signed)
Pt called back to advise that she wants to have the cardiac consult, per manager YL advised pt we need a referral placed by her OBGYN per they are the MD's that advised this to be placed on her, the pt understood and noted she called their office and they advised they did not feel she needed a consult at this time per she did not have any sxs during her SVT for 40seconds to a minute that ranged HR to 160bpm, pt understood however plans to call her insurance companies to make sure they will cover a cardiac consult if a referral is placed and if this is confirmed she will then call her OGYN and request a referral placed in for a cardiac consult per this is a big concern for her, pt made aware if  your signs and symptoms worsen please seek medical attention in your local Emergency Room, do not attempt to drive yourself per further endangerment, call 911 for transport or have someone drive you to the ER as soon as possible. Pt will call back to our office with any further concerns if necessary

## 2013-06-21 NOTE — Telephone Encounter (Signed)
Derry Lory, LPN from [facility] called. Kamariya has a heart monitor on and it showed a critical event, SVT 160 HR over 40 seconds. They contacted pt and she has not had any symptoms. [place] wanted to know if she needed a cardiac consult tomorrow. I spoke with Dr. Despina Hidden. He advised that she didn't need a cardiac consult tomorrow and to continue to wear the monitor. EKG readings were faxed to our office and Dr. Despina Hidden reviewed them. With pt being asymptomatic, will just continue to wear monitor. JSY

## 2013-06-21 NOTE — Telephone Encounter (Signed)
Received incoming call from AJ with [employer] to advise pt had a run of SVT with HR up to 160BPM, for about 40 seconds to a minute, advised a vm was left for the pt to see how she is feeling, asked for critical EKG strips to be faxed, still pending at this time, called Mitch with [employer] to request strips again per only sending the non critical strips, gave verbal update to Dr. Diona Browner to advise, advised critical strips needed to advise further instructions, called pt and she advised that she is sitting in the living room at this time and noted she just just got out the shower about 10 minutes prior to phone call, advised pt of update, pt denied chest pain/SOB/dizzyness/headache, unable to check BP at home, notes she feels just fine and has not noted any concerns/sxs today nor yesterday of palpitations,ETC, advised Dr. Diona Browner pt update, still pending the critical strips at this time, received incoming call from [facility] with [employer] to advise the fax of the serious EKG is on the way, received fax 2 minutes later, Dr. Diona Browner  Reviewed and noted the pt can be scheduled for a Cardiac Consult tomorrow if Dr Mauricio Po would like for [place] to see the pt however the report did not note any concern for testing/medication changes/instructions at this time, called Dr Mauricio Po office and spoke with Marylu Lund whom advised Mauricio Po is out of the office today however she would discuss with Dr Despina Hidden and he advised the pt will not need a cardiac consult at this time and to let the monitor run, this nurse advised Marylu Lund that we also faxed over a copy to their office for review/records, called pt to advise the update, pt understood and Pt will call back to our office with any further concerns if necessary

## 2013-06-22 ENCOUNTER — Telehealth: Payer: Self-pay | Admitting: *Deleted

## 2013-06-22 DIAGNOSIS — R002 Palpitations: Secondary | ICD-10-CM

## 2013-06-22 NOTE — Telephone Encounter (Signed)
Pt states saw Cathie Beams, CNM and heart monitor ordered. Pt was told heart rate increased while on heart monitor. Pt now requesting referral for .

## 2013-06-28 NOTE — Telephone Encounter (Signed)
Pt informed referral for [place] done.

## 2013-06-28 NOTE — Telephone Encounter (Signed)
Already wearing an event monitor. Referral sent

## 2013-07-09 ENCOUNTER — Other Ambulatory Visit: Payer: Self-pay | Admitting: *Deleted

## 2013-07-09 DIAGNOSIS — R002 Palpitations: Secondary | ICD-10-CM

## 2013-07-11 ENCOUNTER — Encounter: Payer: Self-pay | Admitting: Obstetrics & Gynecology

## 2013-07-11 ENCOUNTER — Ambulatory Visit (INDEPENDENT_AMBULATORY_CARE_PROVIDER_SITE_OTHER): Payer: BC Managed Care – PPO | Admitting: Advanced Practice Midwife

## 2013-07-11 VITALS — BP 90/60 | Wt 129.0 lb

## 2013-07-11 DIAGNOSIS — Z1389 Encounter for screening for other disorder: Secondary | ICD-10-CM

## 2013-07-11 DIAGNOSIS — O34219 Maternal care for unspecified type scar from previous cesarean delivery: Secondary | ICD-10-CM

## 2013-07-11 DIAGNOSIS — Z331 Pregnant state, incidental: Secondary | ICD-10-CM

## 2013-07-11 LAB — POCT URINALYSIS DIPSTICK
Glucose, UA: NEGATIVE
Nitrite, UA: NEGATIVE

## 2013-07-11 NOTE — Progress Notes (Signed)
Having pain in the old C-SECTION SITE x 4 week ago.Marland Kitchen

## 2013-07-11 NOTE — Progress Notes (Signed)
C/S scar giving her a little trouble when picking up son.  Encouraged to use good body mechanics.  Feels llike had some HR elevations when wearing event monitor.  Tullos was having some computer trouble whne she tried to schedule visit; encouraged to call them again.  F/U 2 weeks for anatomy scan

## 2013-07-17 ENCOUNTER — Other Ambulatory Visit: Payer: Self-pay | Admitting: Advanced Practice Midwife

## 2013-07-17 DIAGNOSIS — O34219 Maternal care for unspecified type scar from previous cesarean delivery: Secondary | ICD-10-CM

## 2013-07-17 DIAGNOSIS — Z1389 Encounter for screening for other disorder: Secondary | ICD-10-CM

## 2013-07-23 ENCOUNTER — Encounter (INDEPENDENT_AMBULATORY_CARE_PROVIDER_SITE_OTHER): Payer: Self-pay

## 2013-07-23 ENCOUNTER — Encounter: Payer: Self-pay | Admitting: Adult Health

## 2013-07-23 ENCOUNTER — Ambulatory Visit (INDEPENDENT_AMBULATORY_CARE_PROVIDER_SITE_OTHER): Payer: BC Managed Care – PPO | Admitting: Adult Health

## 2013-07-23 ENCOUNTER — Ambulatory Visit (INDEPENDENT_AMBULATORY_CARE_PROVIDER_SITE_OTHER): Payer: BC Managed Care – PPO

## 2013-07-23 VITALS — BP 110/62 | Wt 131.0 lb

## 2013-07-23 DIAGNOSIS — O34219 Maternal care for unspecified type scar from previous cesarean delivery: Secondary | ICD-10-CM

## 2013-07-23 DIAGNOSIS — Z3480 Encounter for supervision of other normal pregnancy, unspecified trimester: Secondary | ICD-10-CM

## 2013-07-23 DIAGNOSIS — Z1389 Encounter for screening for other disorder: Secondary | ICD-10-CM

## 2013-07-23 DIAGNOSIS — Z363 Encounter for antenatal screening for malformations: Secondary | ICD-10-CM

## 2013-07-23 LAB — POCT URINALYSIS DIPSTICK
Glucose, UA: NEGATIVE
Leukocytes, UA: NEGATIVE
Nitrite, UA: NEGATIVE
Protein, UA: NEGATIVE

## 2013-07-23 MED ORDER — DOCOSAHEXAENOIC ACID 200 MG PO CAPS: 1.0000 | ORAL_CAPSULE | Freq: Every day | ORAL | Status: DC

## 2013-07-23 NOTE — Patient Instructions (Signed)
Pregnancy - Second Trimester The second trimester of pregnancy (3 to 6 months) is a period of rapid growth for you and your baby. At the end of the sixth month, your baby is about 9 inches long and weighs 1 1/2 pounds. You will begin to feel the baby move between 18 and 20 weeks of the pregnancy. This is called quickening. Weight gain is faster. A clear fluid (colostrum) may leak out of your breasts. You may feel small contractions of the womb (uterus). This is known as false labor or Braxton-Hicks contractions. This is like a practice for labor when the baby is ready to be born. Usually, the problems with morning sickness have usually passed by the end of your first trimester. Some women develop small dark blotches (called cholasma, mask of pregnancy) on their face that usually goes away after the baby is born. Exposure to the sun makes the blotches worse. Acne may also develop in some pregnant women and pregnant women who have acne, may find that it goes away. PRENATAL EXAMS  Blood work may continue to be done during prenatal exams. These tests are done to check on your health and the probable health of your baby. Blood work is used to follow your blood levels (hemoglobin). Anemia (low hemoglobin) is common during pregnancy. Iron and vitamins are given to help prevent this. You will also be checked for diabetes between 24 and 28 weeks of the pregnancy. Some of the previous blood tests may be repeated.  The size of the uterus is measured during each visit. This is to make sure that the baby is continuing to grow properly according to the dates of the pregnancy.  Your blood pressure is checked every prenatal visit. This is to make sure you are not getting toxemia.  Your urine is checked to make sure you do not have an infection, diabetes or protein in the urine.  Your weight is checked often to make sure gains are happening at the suggested rate. This is to ensure that both you and your baby are growing  normally.  Sometimes, an ultrasound is performed to confirm the proper growth and development of the baby. This is a test which bounces harmless sound waves off the baby so your caregiver can more accurately determine due dates. Sometimes, a test is done on the amniotic fluid surrounding the baby. This test is called an amniocentesis. The amniotic fluid is obtained by sticking a needle into the belly (abdomen). This is done to check the chromosomes in instances where there is a concern about possible genetic problems with the baby. It is also sometimes done near the end of pregnancy if an early delivery is required. In this case, it is done to help make sure the baby's lungs are mature enough for the baby to live outside of the womb. CHANGES OCCURING IN THE SECOND TRIMESTER OF PREGNANCY Your body goes through many changes during pregnancy. They vary from person to person. Talk to your caregiver about changes you notice that you are concerned about.  During the second trimester, you will likely have an increase in your appetite. It is normal to have cravings for certain foods. This varies from person to person and pregnancy to pregnancy.  Your lower abdomen will begin to bulge.  You may have to urinate more often because the uterus and baby are pressing on your bladder. It is also common to get more bladder infections during pregnancy. You can help this by drinking lots of fluids  and emptying your bladder before and after intercourse.  You may begin to get stretch marks on your hips, abdomen, and breasts. These are normal changes in the body during pregnancy. There are no exercises or medicines to take that prevent this change.  You may begin to develop swollen and bulging veins (varicose veins) in your legs. Wearing support hose, elevating your feet for 15 minutes, 3 to 4 times a day and limiting salt in your diet helps lessen the problem.  Heartburn may develop as the uterus grows and pushes up  against the stomach. Antacids recommended by your caregiver helps with this problem. Also, eating smaller meals 4 to 5 times a day helps.  Constipation can be treated with a stool softener or adding bulk to your diet. Drinking lots of fluids, and eating vegetables, fruits, and whole grains are helpful.  Exercising is also helpful. If you have been very active up until your pregnancy, most of these activities can be continued during your pregnancy. If you have been less active, it is helpful to start an exercise program such as walking.  Hemorrhoids may develop at the end of the second trimester. Warm sitz baths and hemorrhoid cream recommended by your caregiver helps hemorrhoid problems.  Backaches may develop during this time of your pregnancy. Avoid heavy lifting, wear low heal shoes, and practice good posture to help with backache problems.  Some pregnant women develop tingling and numbness of their hand and fingers because of swelling and tightening of ligaments in the wrist (carpel tunnel syndrome). This goes away after the baby is born.  As your breasts enlarge, you may have to get a bigger bra. Get a comfortable, cotton, support bra. Do not get a nursing bra until the last month of the pregnancy if you will be nursing the baby.  You may get a dark line from your belly button to the pubic area called the linea nigra.  You may develop rosy cheeks because of increase blood flow to the face.  You may develop spider looking lines of the face, neck, arms, and chest. These go away after the baby is born. HOME CARE INSTRUCTIONS   It is extremely important to avoid all smoking, herbs, alcohol, and unprescribed drugs during your pregnancy. These chemicals affect the formation and growth of the baby. Avoid these chemicals throughout the pregnancy to ensure the delivery of a healthy infant.  Most of your home care instructions are the same as suggested for the first trimester of your pregnancy.  Keep your caregiver's appointments. Follow your caregiver's instructions regarding medicine use, exercise, and diet.  During pregnancy, you are providing food for you and your baby. Continue to eat regular, well-balanced meals. Choose foods such as meat, fish, milk and other low fat dairy products, vegetables, fruits, and whole-grain breads and cereals. Your caregiver will tell you of the ideal weight gain.  A physical sexual relationship may be continued up until near the end of pregnancy if there are no other problems. Problems could include early (premature) leaking of amniotic fluid from the membranes, vaginal bleeding, abdominal pain, or other medical or pregnancy problems.  Exercise regularly if there are no restrictions. Check with your caregiver if you are unsure of the safety of some of your exercises. The greatest weight gain will occur in the last 2 trimesters of pregnancy. Exercise will help you:  Control your weight.  Get you in shape for labor and delivery.  Lose weight after you have the baby.  Wear  a good support or jogging bra for breast tenderness during pregnancy. This may help if worn during sleep. Pads or tissues may be used in the bra if you are leaking colostrum.  Do not use hot tubs, steam rooms or saunas throughout the pregnancy.  Wear your seat belt at all times when driving. This protects you and your baby if you are in an accident.  Avoid raw meat, uncooked cheese, cat litter boxes, and soil used by cats. These carry germs that can cause birth defects in the baby.  The second trimester is also a good time to visit your dentist for your dental health if this has not been done yet. Getting your teeth cleaned is okay. Use a soft toothbrush. Brush gently during pregnancy.  It is easier to leak urine during pregnancy. Tightening up and strengthening the pelvic muscles will help with this problem. Practice stopping your urination while you are going to the bathroom.  These are the same muscles you need to strengthen. It is also the muscles you would use as if you were trying to stop from passing gas. You can practice tightening these muscles up 10 times a set and repeating this about 3 times per day. Once you know what muscles to tighten up, do not perform these exercises during urination. It is more likely to contribute to an infection by backing up the urine.  Ask for help if you have financial, counseling, or nutritional needs during pregnancy. Your caregiver will be able to offer counseling for these needs as well as refer you for other special needs.  Your skin may become oily. If so, wash your face with mild soap, use non-greasy moisturizer and oil or cream based makeup. MEDICINES AND DRUG USE IN PREGNANCY  Take prenatal vitamins as directed. The vitamin should contain 1 milligram of folic acid. Keep all vitamins out of reach of children. Only a couple vitamins or tablets containing iron may be fatal to a baby or young child when ingested.  Avoid use of all medicines, including herbs, over-the-counter medicines, not prescribed or suggested by your caregiver. Only take over-the-counter or prescription medicines for pain, discomfort, or fever as directed by your caregiver. Do not use aspirin.  Let your caregiver also know about herbs you may be using.  Alcohol is related to a number of birth defects. This includes fetal alcohol syndrome. All alcohol, in any form, should be avoided completely. Smoking will cause low birth rate and premature babies.  Street or illegal drugs are very harmful to the baby. They are absolutely forbidden. A baby born to an addicted mother will be addicted at birth. The baby will go through the same withdrawal an adult does. SEEK MEDICAL CARE IF:  You have any concerns or worries during your pregnancy. It is better to call with your questions if you feel they cannot wait, rather than worry about them. SEEK IMMEDIATE MEDICAL CARE  IF:   An unexplained oral temperature above 102 F (38.9 C) develops, or as your caregiver suggests.  You have leaking of fluid from the vagina (birth canal). If leaking membranes are suspected, take your temperature and tell your caregiver of this when you call.  There is vaginal spotting, bleeding, or passing clots. Tell your caregiver of the amount and how many pads are used. Light spotting in pregnancy is common, especially following intercourse.  You develop a bad smelling vaginal discharge with a change in the color from clear to white.  You continue to feel  sick to your stomach (nauseated) and have no relief from remedies suggested. You vomit blood or coffee ground-like materials.  You lose more than 2 pounds of weight or gain more than 2 pounds of weight over 1 week, or as suggested by your caregiver.  You notice swelling of your face, hands, feet, or legs.  You get exposed to [place] measles and have never had them.  You are exposed to fifth disease or chickenpox.  You develop belly (abdominal) pain. Round ligament discomfort is a common non-cancerous (benign) cause of abdominal pain in pregnancy. Your caregiver still must evaluate you.  You develop a bad headache that does not go away.  You develop fever, diarrhea, pain with urination, or shortness of breath.  You develop visual problems, blurry, or double vision.  You fall or are in a car accident or any kind of trauma.  There is mental or physical violence at home. Document Released: 09/14/2001 Document Revised: 06/14/2012 Document Reviewed: 03/19/2009 [facility] Patient Information 2014 [place], [place]. Follow up in 4 weeks

## 2013-07-23 NOTE — Progress Notes (Signed)
Pt here today for [place] and routine visit. Pt denies any problems or concerns at this time.

## 2013-07-23 NOTE — Progress Notes (Signed)
Needs refill for prenatal vitamins, has complaints of breast pain, on exam no dominant mass retraction or nipple discharge, no bleeding or discharge.follow up in 4 weeks

## 2013-07-23 NOTE — Progress Notes (Signed)
U/S(18+6wks)-active fetus, meas c/w dates, fluid wnl, ant gr 0 placenta, cx long and closed (3.4cm), bilateral adnexa wnl, no major abnl noted, female fetus "[place]"

## 2013-07-24 ENCOUNTER — Telehealth: Payer: Self-pay | Admitting: Obstetrics & Gynecology

## 2013-07-24 NOTE — Telephone Encounter (Signed)
C/o sore throat, runny nose, no fever. Informed pt to gargle with warm salt water, lozenges, and OTC plain Robitussin, if no improvement or begins to run a fever to call our office back. Pt verbalized understanding.

## 2013-07-31 ENCOUNTER — Ambulatory Visit: Payer: BC Managed Care – PPO | Admitting: Cardiovascular Disease

## 2013-07-31 ENCOUNTER — Encounter: Payer: Self-pay | Admitting: Cardiovascular Disease

## 2013-08-12 ENCOUNTER — Emergency Department (HOSPITAL_COMMUNITY)
Admission: EM | Admit: 2013-08-12 | Discharge: 2013-08-13 | Disposition: A | Discharge status: Home / Self Care | Payer: BC Managed Care – PPO | Attending: Emergency Medicine | Admitting: Emergency Medicine

## 2013-08-12 ENCOUNTER — Encounter (HOSPITAL_COMMUNITY): Payer: Self-pay | Admitting: Emergency Medicine

## 2013-08-12 DIAGNOSIS — Z79899 Other long term (current) drug therapy: Secondary | ICD-10-CM | POA: Insufficient documentation

## 2013-08-12 DIAGNOSIS — Z8619 Personal history of other infectious and parasitic diseases: Secondary | ICD-10-CM | POA: Insufficient documentation

## 2013-08-12 DIAGNOSIS — IMO0002 Reserved for concepts with insufficient information to code with codable children: Secondary | ICD-10-CM | POA: Insufficient documentation

## 2013-08-12 DIAGNOSIS — Z8719 Personal history of other diseases of the digestive system: Secondary | ICD-10-CM | POA: Insufficient documentation

## 2013-08-12 DIAGNOSIS — Y929 Unspecified place or not applicable: Secondary | ICD-10-CM | POA: Insufficient documentation

## 2013-08-12 DIAGNOSIS — Y939 Activity, unspecified: Secondary | ICD-10-CM | POA: Insufficient documentation

## 2013-08-12 DIAGNOSIS — O99330 Smoking (tobacco) complicating pregnancy, unspecified trimester: Secondary | ICD-10-CM | POA: Insufficient documentation

## 2013-08-12 DIAGNOSIS — O9989 Other specified diseases and conditions complicating pregnancy, childbirth and the puerperium: Secondary | ICD-10-CM | POA: Insufficient documentation

## 2013-08-12 DIAGNOSIS — Z3490 Encounter for supervision of normal pregnancy, unspecified, unspecified trimester: Secondary | ICD-10-CM

## 2013-08-12 DIAGNOSIS — S301XXA Contusion of abdominal wall, initial encounter: Secondary | ICD-10-CM
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
## 2013-08-20 ENCOUNTER — Ambulatory Visit (INDEPENDENT_AMBULATORY_CARE_PROVIDER_SITE_OTHER): Payer: BC Managed Care – PPO | Admitting: Obstetrics and Gynecology

## 2013-08-20 VITALS — BP 108/52 | Wt 135.8 lb

## 2013-08-20 DIAGNOSIS — N898 Other specified noninflammatory disorders of vagina: Secondary | ICD-10-CM

## 2013-08-20 DIAGNOSIS — O2390 Unspecified genitourinary tract infection in pregnancy, unspecified trimester: Secondary | ICD-10-CM

## 2013-08-20 DIAGNOSIS — Z3482 Encounter for supervision of other normal pregnancy, second trimester: Secondary | ICD-10-CM

## 2013-08-20 DIAGNOSIS — Z1389 Encounter for screening for other disorder: Secondary | ICD-10-CM

## 2013-08-20 DIAGNOSIS — O34219 Maternal care for unspecified type scar from previous cesarean delivery: Secondary | ICD-10-CM

## 2013-08-20 DIAGNOSIS — Z331 Pregnant state, incidental: Secondary | ICD-10-CM

## 2013-08-20 LAB — POCT WET PREP (WET MOUNT): Trichomonas Wet Prep HPF POC: NEGATIVE

## 2013-08-20 LAB — POCT URINALYSIS DIPSTICK
Blood, UA: NEGATIVE
Ketones, UA: NEGATIVE
Nitrite, UA: NEGATIVE
Protein, UA: NEGATIVE

## 2013-08-20 NOTE — Patient Instructions (Signed)

## 2013-08-20 NOTE — Progress Notes (Signed)
1.Vag d/c noted x 2 wk, lite yellow, no bleeding no SROM,  Cx long closed visually, Wet prep: epithelials only, no wbc. 2. Prior Cesarean for breech, Low Transverse, Desires TOLAC, will review consent, copy to pt.

## 2013-09-02 ENCOUNTER — Observation Stay (HOSPITAL_COMMUNITY)
Admission: AD | Admit: 2013-09-02 | Discharge: 2013-09-03 | Disposition: A | Discharge status: Home / Self Care | Payer: BC Managed Care – PPO | Source: Ambulatory Visit | Attending: Obstetrics and Gynecology | Admitting: Obstetrics and Gynecology

## 2013-09-02 ENCOUNTER — Encounter (HOSPITAL_COMMUNITY): Payer: Self-pay

## 2013-09-02 ENCOUNTER — Inpatient Hospital Stay (HOSPITAL_COMMUNITY): Payer: BC Managed Care – PPO

## 2013-09-02 DIAGNOSIS — R109 Unspecified abdominal pain: Secondary | ICD-10-CM | POA: Insufficient documentation

## 2013-09-02 DIAGNOSIS — O4690 Antepartum hemorrhage, unspecified, unspecified trimester: Principal | ICD-10-CM | POA: Diagnosis present

## 2013-09-02 DIAGNOSIS — O4692 Antepartum hemorrhage, unspecified, second trimester: Secondary | ICD-10-CM

## 2013-09-02 DIAGNOSIS — O09891 Supervision of other high risk pregnancies, first trimester: Secondary | ICD-10-CM

## 2013-09-02 DIAGNOSIS — Z3482 Encounter for supervision of other normal pregnancy, second trimester: Secondary | ICD-10-CM

## 2013-09-02 DIAGNOSIS — N939 Abnormal uterine and vaginal bleeding, unspecified: Secondary | ICD-10-CM

## 2013-09-02 DIAGNOSIS — Z98891 History of uterine scar from previous surgery: Secondary | ICD-10-CM

## 2013-09-02 DIAGNOSIS — O34219 Maternal care for unspecified type scar from previous cesarean delivery: Secondary | ICD-10-CM | POA: Insufficient documentation

## 2013-09-02 LAB — URINALYSIS, ROUTINE W REFLEX MICROSCOPIC
Glucose, UA: 100 mg/dL — AB
Leukocytes, UA: NEGATIVE
Nitrite: NEGATIVE
Specific Gravity, Urine: 1.025 (ref 1.005–1.030)
Urobilinogen, UA: 0.2 mg/dL (ref 0.0–1.0)
pH: 6 (ref 5.0–8.0)

## 2013-09-02 LAB — URINE MICROSCOPIC-ADD ON

## 2013-09-02 LAB — CBC
HCT: 34 % — ABNORMAL LOW (ref 36.0–46.0)
MCH: 30.1 pg (ref 26.0–34.0)
MCV: 87.4 fL (ref 78.0–100.0)
Platelets: 288 10*3/uL (ref 150–400)
RDW: 13.1 % (ref 11.5–15.5)

## 2013-09-02 MED ORDER — ACETAMINOPHEN 325 MG PO TABS: 650.0000 mg | ORAL_TABLET | ORAL | Status: DC | PRN

## 2013-09-02 MED ORDER — SODIUM CHLORIDE 0.9 % IJ SOLN: 3.0000 mL | INTRAMUSCULAR | Status: DC | PRN

## 2013-09-02 MED ORDER — DOCUSATE SODIUM 100 MG PO CAPS
100.0000 mg | ORAL_CAPSULE | Freq: Every day | ORAL | Status: DC
  Administered 2013-09-03: 100 mg via ORAL
  Filled 2013-09-02 (×3): qty 1

## 2013-09-02 MED ORDER — SODIUM CHLORIDE 0.9 % IV SOLN: 250.0000 mL | INTRAVENOUS | Status: DC | PRN

## 2013-09-02 MED ORDER — CALCIUM CARBONATE ANTACID 500 MG PO CHEW
2.0000 | CHEWABLE_TABLET | ORAL | Status: DC | PRN
  Filled 2013-09-02: qty 2

## 2013-09-02 MED ORDER — ZOLPIDEM TARTRATE 5 MG PO TABS: 5.0000 mg | ORAL_TABLET | Freq: Every evening | ORAL | Status: DC | PRN

## 2013-09-02 MED ORDER — SODIUM CHLORIDE 0.9 % IJ SOLN
3.0000 mL | Freq: Two times a day (BID) | INTRAMUSCULAR | Status: DC
  Administered 2013-09-02: 3 mL via INTRAVENOUS

## 2013-09-02 MED ORDER — PRENATAL MULTIVITAMIN CH
1.0000 | ORAL_TABLET | Freq: Every day | ORAL | Status: DC
  Administered 2013-09-03: 1 via ORAL
  Filled 2013-09-02 (×2): qty 1

## 2013-09-02 NOTE — H&P (Signed)
Attestation of Attending Supervision of Advanced Practitioner: Evaluation and management procedures were performed by the PA/NP/CNM/OB Fellow under my supervision/collaboration. Chart reviewed and agree with management and plan.  Philopater Mucha V 09/02/2013 9:48 PM

## 2013-09-02 NOTE — MAU Note (Signed)
Pt presents via EMS with complaints of vaginal bleeding and lower abdominal pain after intercourse.
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
## 2013-09-02 NOTE — Plan of Care (Signed)
Problem: Consults Goal: Birthing Suites Patient Information Press F2 to bring up selections list Outcome: Completed/Met Date Met:  09/02/13  Pt < [redacted] weeks EGA

## 2013-09-03 DIAGNOSIS — N898 Other specified noninflammatory disorders of vagina: Secondary | ICD-10-CM

## 2013-09-03 DIAGNOSIS — O4690 Antepartum hemorrhage, unspecified, unspecified trimester: Secondary | ICD-10-CM

## 2013-09-03 LAB — TYPE AND SCREEN: Antibody Screen: NEGATIVE

## 2013-09-03 MED ORDER — SODIUM CHLORIDE 0.9 % IV SOLN
INTRAVENOUS | Status: DC
  Administered 2013-09-03 (×2): via INTRAVENOUS

## 2013-09-03 MED ORDER — NIFEDIPINE 10 MG PO CAPS
10.0000 mg | ORAL_CAPSULE | Freq: Once | ORAL | Status: AC
  Administered 2013-09-03: 10 mg via ORAL
  Filled 2013-09-03: qty 1

## 2013-09-03 NOTE — Discharge Summary (Signed)
Antenatal Physician Discharge Summary  Patient ID: [name] MRN: [id number] DOB/AGE: 22-25-1992 22 y.o.  Admit date: 09/02/2013 Discharge date: 09/03/2013  Admission Diagnoses: IUP at [redacted]w[redacted]d, postcoital vaginal bleeding  Discharge Diagnoses: IUP at [redacted]w[redacted]d, resolved bleeding  Prenatal Procedures: NST and ultrasound  Significant Diagnostic Studies:  Results for orders placed during the hospital encounter of 09/02/13 (from the past 168 hour(s))  URINALYSIS, ROUTINE W REFLEX MICROSCOPIC   Collection Time    09/02/13  5:25 PM      Result Value Range   Color, Urine YELLOW  YELLOW   APPearance CLOUDY (*) CLEAR   Specific Gravity, Urine 1.025  1.005 - 1.030   pH 6.0  5.0 - 8.0   Glucose, UA 100 (*) NEGATIVE mg/dL   Hgb urine dipstick LARGE (*) NEGATIVE   Bilirubin Urine NEGATIVE  NEGATIVE   Ketones, ur NEGATIVE  NEGATIVE mg/dL   Protein, ur NEGATIVE  NEGATIVE mg/dL   Urobilinogen, UA 0.2  0.0 - 1.0 mg/dL   Nitrite NEGATIVE  NEGATIVE   Leukocytes, UA NEGATIVE  NEGATIVE  URINE MICROSCOPIC-ADD ON   Collection Time    09/02/13  5:25 PM      Result Value Range   Squamous Epithelial / LPF RARE  RARE   WBC, UA 0-2  <3 WBC/hpf   RBC / HPF 21-50  <3 RBC/hpf   Bacteria, UA FEW (*) RARE   Urine-Other MUCOUS PRESENT    CBC   Collection Time    09/02/13  7:50 PM      Result Value Range   WBC 10.3  4.0 - 10.5 K/uL   RBC 3.89  3.87 - 5.11 MIL/uL   Hemoglobin 11.7 (*) 12.0 - 15.0 g/dL   HCT 40.9 (*) 81.1 - 91.4 %   MCV 87.4  78.0 - 100.0 fL   MCH 30.1  26.0 - 34.0 pg   MCHC 34.4  30.0 - 36.0 g/dL   RDW 78.2  95.6 - 21.3 %   Platelets 288  150 - 400 K/uL  TYPE AND SCREEN   Collection Time    09/03/13  6:10 AM      Result Value Range   ABO/RH(D) O POS     Antibody Screen NEG     Sample Expiration 09/06/2013     Treatments: IV hydration and Procardia 10 mg po x 1 dose  Hospital Course:  This is a 22 y.o. G2P1001 with IUP at [redacted]w[redacted]d admitted for postcoital bleeding on 09/02/13. On  admission exam, no further active bleeding was noted just dark blood in vault.  No bleeding from os noted.  Ultrasound did not show a placental abruption, previa or other anomaly. No leaking of fluid; endorsed active fetal movement.  No contractions at time of admission but on 09/03/13, she was noted to have a period of frequent mild contractions, no associated bleeding. She was given one dose of Procardia and IV fluids and monitored for 12 hours.  The fetal heart rate monitoring remained reassuring, and she had no preterm contractions, bleeding, other signs/symptoms of progressing preterm labor or other maternal-fetal concerns.  She was deemed stable for discharge to home with outpatient follow up.  Discharge Exam: BP 112/54  Pulse 80  Temp(Src) 98.2 F (36.8 C) (Oral)  Resp 18  Ht 4\' 11"  (1.499 m)  Wt 135 lb (61.236 kg)  BMI 27.25 kg/m2  LMP 03/13/2013 General appearance: alert and no distress Resp: clear to auscultation bilaterally Cardio: regular rate and rhythm GI: soft, non-tender; bowel  sounds normal; no masses,  no organomegaly Pelvic: no bleeding noted on pad Extremities: extremities normal, atraumatic, no cyanosis or edema and Homans sign is negative, no sign of DVT  Discharge Condition: Stable  Disposition: 01-Home or Self Care   Future Appointments Provider Department Dept Phone   09/17/2013 10:15 AM Marge Duncans, CNM [facility] [phone]       Medication List         Docosahexaenoic Acid 200 MG Caps  Commonly known as:  PRENATAL DHA  Take 1 capsule (200 mg total) by mouth daily.           Follow-up Information   Follow up with [facility]. (Come to [facility] for any concerning symptoms)    Specialty:  Obstetrics and Gynecology   Contact information:   [address] [place] [address] [phone]      Signed: Tereso Newcomer M.D. 09/03/2013, 6:32 PM

## 2013-09-03 NOTE — Progress Notes (Signed)
DC  instructions discussing limited activity at home no questions or concerns noted

## 2013-09-03 NOTE — Progress Notes (Signed)
FACULTY PRACTICE ANTEPARTUM(COMPREHENSIVE) NOTE  [name] is a 22 y.o. G2P1001 at [redacted]w[redacted]d by LMP, early ultrasound, prior cesarean x 1 who is admitted for postcoital bleeding.   Fetal presentation is cephalic. Length of Stay:  1  Days  Subjective: Pt has no more bleeding, but was awakened for mild uterine contractions, has had mild contractions q 5 mins over the last 1-2 hours.   Patient reports the fetal movement as active. Patient reports uterine contraction  activity as irregular, every 5 minutes.mild, but perceived by pt. No prior episodes noted by pt before last night Patient reports  vaginal bleeding as none. Patient describes fluid per vagina as None.  Vitals:  Blood pressure 99/53, pulse 83, temperature 97.8 F (36.6 C), temperature source Oral, resp. rate 18, height 4\' 11"  (1.499 m), weight 61.236 kg (135 lb), last menstrual period 03/13/2013. Physical Examination:  General appearance - alert, well appearing, and in no distress Heart - normal rate and regular rhythm Abdomen - soft, nontender, nondistended Fundal Height:  size equals dates Cervical Exam: Not evaluated. and exam by R.Odom DO yesterday saw some old blood but none seen in os and none being expelled at speculum exam at admission   Extremities: extremities normal, atraumatic, no cyanosis or edema and Homans sign is negative, no sign of DVT with DTRs 2+ bilaterally Membranes:intact  Fetal Monitoring:  Baseline: 145 bpm, Variability: Good {> 6 bpm), Accelerations: Reactive and Decelerations: Absent  Labs:  Results for orders placed during the hospital encounter of 09/02/13 (from the past 24 hour(s))  URINALYSIS, ROUTINE W REFLEX MICROSCOPIC   Collection Time    09/02/13  5:25 PM      Result Value Range   Color, Urine YELLOW  YELLOW   APPearance CLOUDY (*) CLEAR   Specific Gravity, Urine 1.025  1.005 - 1.030   pH 6.0  5.0 - 8.0   Glucose, UA 100 (*) NEGATIVE mg/dL   Hgb urine dipstick LARGE (*) NEGATIVE   Bilirubin Urine NEGATIVE  NEGATIVE   Ketones, ur NEGATIVE  NEGATIVE mg/dL   Protein, ur NEGATIVE  NEGATIVE mg/dL   Urobilinogen, UA 0.2  0.0 - 1.0 mg/dL   Nitrite NEGATIVE  NEGATIVE   Leukocytes, UA NEGATIVE  NEGATIVE  URINE MICROSCOPIC-ADD ON   Collection Time    09/02/13  5:25 PM      Result Value Range   Squamous Epithelial / LPF RARE  RARE   WBC, UA 0-2  <3 WBC/hpf   RBC / HPF 21-50  <3 RBC/hpf   Bacteria, UA FEW (*) RARE   Urine-Other MUCOUS PRESENT    CBC   Collection Time    09/02/13  7:50 PM      Result Value Range   WBC 10.3  4.0 - 10.5 K/uL   RBC 3.89  3.87 - 5.11 MIL/uL   Hemoglobin 11.7 (*) 12.0 - 15.0 g/dL   HCT 78.2 (*) 95.6 - 21.3 %   MCV 87.4  78.0 - 100.0 fL   MCH 30.1  26.0 - 34.0 pg   MCHC 34.4  30.0 - 36.0 g/dL   RDW 08.6  57.8 - 46.9 %   Platelets 288  150 - 400 K/uL  TYPE AND SCREEN   Collection Time    09/03/13  6:10 AM      Result Value Range   ABO/RH(D) O POS     Antibody Screen PENDING     Sample Expiration 09/06/2013      Imaging Studies:  Medications:  Scheduled . docusate sodium  100 mg Oral Daily  . NIFEdipine  10 mg Oral Once  . prenatal multivitamin  1 tablet Oral Q1200  . sodium chloride  3 mL Intravenous Q12H   I have reviewed the patient's current medications.  ASSESSMENT: preg 24 w 6 d Postcoital vaginal bleeding, now resolved No u/s evidence of abruption or previa Anterior and fundal placenta. Mild uterine contractions this am  Patient Active Problem List   Diagnosis Date Noted  . Vaginal bleeding 09/02/2013  . Susceptible to varicella (non-immune), currently pregnant 05/02/2013  . Supervision of other normal pregnancy 05/01/2013  . Short interval between pregnancies complicating pregnancy, antepartum 05/01/2013  . Previous cesarean section 05/01/2013  . Smoker 05/01/2013    PLAN: Will treat procardia x 1 10 mg, if stable d/c home later today.   Jadis Mika V 09/03/2013,7:23 AM

## 2013-09-03 NOTE — Progress Notes (Signed)
UR completed 

## 2013-09-04 ENCOUNTER — Ambulatory Visit (INDEPENDENT_AMBULATORY_CARE_PROVIDER_SITE_OTHER): Payer: BC Managed Care – PPO | Admitting: Advanced Practice Midwife

## 2013-09-04 VITALS — BP 98/60 | Wt 140.5 lb

## 2013-09-04 DIAGNOSIS — O4692 Antepartum hemorrhage, unspecified, second trimester: Secondary | ICD-10-CM

## 2013-09-04 DIAGNOSIS — O34219 Maternal care for unspecified type scar from previous cesarean delivery: Secondary | ICD-10-CM

## 2013-09-04 DIAGNOSIS — O368190 Decreased fetal movements, unspecified trimester, not applicable or unspecified: Secondary | ICD-10-CM

## 2013-09-04 DIAGNOSIS — Z1389 Encounter for screening for other disorder: Secondary | ICD-10-CM

## 2013-09-04 DIAGNOSIS — O2390 Unspecified genitourinary tract infection in pregnancy, unspecified trimester: Secondary | ICD-10-CM

## 2013-09-04 DIAGNOSIS — O368191 Decreased fetal movements, unspecified trimester, fetus 1: Secondary | ICD-10-CM

## 2013-09-04 DIAGNOSIS — Z3482 Encounter for supervision of other normal pregnancy, second trimester: Secondary | ICD-10-CM

## 2013-09-04 DIAGNOSIS — O99019 Anemia complicating pregnancy, unspecified trimester: Secondary | ICD-10-CM

## 2013-09-04 DIAGNOSIS — Z331 Pregnant state, incidental: Secondary | ICD-10-CM

## 2013-09-04 LAB — POCT URINALYSIS DIPSTICK
Blood, UA: NEGATIVE
Glucose, UA: NEGATIVE
Ketones, UA: NEGATIVE
Leukocytes, UA: NEGATIVE
Nitrite, UA: NEGATIVE
Protein, UA: NEGATIVE

## 2013-09-04 NOTE — Progress Notes (Signed)
Follow up from [facility]- observed for post cotial bleeding and some contractions. Bleeding has resolved. C/o of some watery discharge with a change in odor. No pooling seen on exam. Wet prep negative. Having some decreased movement since being discharged from hospital. NST reactive. F/u in 2 weeks PN2 and visit.

## 2013-09-04 NOTE — Patient Instructions (Addendum)
1. Before your test, do not eat or drink anything for 8-10 hours prior to your  appointment (a small amount of water is allowed and you may take any medicines you normally take). 2. When you arrive, your blood will be drawn for a 'fasting' blood sugar level.  Then you will be given a sweetened carbonated beverage to drink. You should  complete drinking this beverage within five minutes. After finishing the  beverage, you will have your blood drawn exactly 1 and 2 hours later. Having  your blood drawn on time is an important part of this test. A total of three blood  samples will be done. 3. The test takes approximately 2  hours. During the test, do not have anything to  eat or drink. Do not smoke, chew gum (not even sugarless gum) or use breath mints.  4. During the test you should remain close by and seated as much as possible and  avoid walking around. You may want to bring a book or something else to  occupy your time.  5. After your test, you may eat and drink as normal. You may want to bring a snack  to eat after the test is finished. Your provider will advise you as to the results of  this test and any follow-up if necessary  

## 2013-09-05 ENCOUNTER — Encounter: Payer: BC Managed Care – PPO | Admitting: Obstetrics and Gynecology

## 2013-09-17 ENCOUNTER — Other Ambulatory Visit: Payer: Self-pay | Admitting: Women's Health

## 2013-09-17 ENCOUNTER — Other Ambulatory Visit: Payer: BC Managed Care – PPO

## 2013-09-17 ENCOUNTER — Ambulatory Visit (INDEPENDENT_AMBULATORY_CARE_PROVIDER_SITE_OTHER): Payer: BC Managed Care – PPO | Admitting: Women's Health

## 2013-09-17 ENCOUNTER — Encounter: Payer: Self-pay | Admitting: Women's Health

## 2013-09-17 VITALS — BP 104/60 | Wt 140.5 lb

## 2013-09-17 DIAGNOSIS — Z331 Pregnant state, incidental: Secondary | ICD-10-CM

## 2013-09-17 DIAGNOSIS — Z3482 Encounter for supervision of other normal pregnancy, second trimester: Secondary | ICD-10-CM

## 2013-09-17 DIAGNOSIS — O34219 Maternal care for unspecified type scar from previous cesarean delivery: Secondary | ICD-10-CM

## 2013-09-17 DIAGNOSIS — Z1389 Encounter for screening for other disorder: Secondary | ICD-10-CM

## 2013-09-17 DIAGNOSIS — Z23 Encounter for immunization: Secondary | ICD-10-CM

## 2013-09-17 DIAGNOSIS — O210 Mild hyperemesis gravidarum: Secondary | ICD-10-CM

## 2013-09-17 DIAGNOSIS — O99019 Anemia complicating pregnancy, unspecified trimester: Secondary | ICD-10-CM

## 2013-09-17 DIAGNOSIS — O99330 Smoking (tobacco) complicating pregnancy, unspecified trimester: Secondary | ICD-10-CM

## 2013-09-17 LAB — POCT URINALYSIS DIPSTICK
Blood, UA: NEGATIVE
Glucose, UA: NEGATIVE
Nitrite, UA: NEGATIVE
Protein, UA: NEGATIVE

## 2013-09-17 LAB — CBC
HCT: 33.2 % — ABNORMAL LOW (ref 36.0–46.0)
Hemoglobin: 11.3 g/dL — ABNORMAL LOW (ref 12.0–15.0)
MCH: 30.1 pg (ref 26.0–34.0)
MCHC: 34 g/dL (ref 30.0–36.0)
Platelets: 293 10*3/uL (ref 150–400)
RBC: 3.76 MIL/uL — ABNORMAL LOW (ref 3.87–5.11)
WBC: 9 10*3/uL (ref 4.0–10.5)

## 2013-09-17 MED ORDER — INFLUENZA VAC SPLIT QUAD 0.5 ML IM SUSP
0.5000 mL | Freq: Once | INTRAMUSCULAR | Status: AC
  Administered 2013-09-17: 0.5 mL via INTRAMUSCULAR

## 2013-09-17 NOTE — Addendum Note (Signed)
Addended by: Colen Darling on: 09/17/2013 11:28 AM   Modules accepted: Orders

## 2013-09-17 NOTE — Progress Notes (Signed)
Reports good fm. Denies uc's, lof, vb, urinary frequency, urgency, hesitancy, or dysuria.  No complaints.  Reviewed ptl s/s, fkc.  All questions answered. F/U in 4 wks for visit. Pn2 today.

## 2013-09-17 NOTE — Patient Instructions (Signed)
Third Trimester of Pregnancy  The third trimester is from week 29 through week 42, months 7 through 9. The third trimester is a time when the fetus is growing rapidly. At the end of the ninth month, the fetus is about 20 inches in length and weighs 6 10 pounds.   BODY CHANGES  Your body goes through many changes during pregnancy. The changes vary from woman to woman.    Your weight will continue to increase. You can expect to gain 25 35 pounds (11 16 kg) by the end of the pregnancy.   You may begin to get stretch marks on your hips, abdomen, and breasts.   You may urinate more often because the fetus is moving lower into your pelvis and pressing on your bladder.   You may develop or continue to have heartburn as a result of your pregnancy.   You may develop constipation because certain hormones are causing the muscles that push waste through your intestines to slow down.   You may develop hemorrhoids or swollen, bulging veins (varicose veins).   You may have pelvic pain because of the weight gain and pregnancy hormones relaxing your joints between the bones in your pelvis. Back aches may result from over exertion of the muscles supporting your posture.   Your breasts will continue to grow and be tender. A yellow discharge may leak from your breasts called colostrum.   Your belly button may stick out.   You may feel short of breath because of your expanding uterus.   You may notice the fetus "dropping," or moving lower in your abdomen.   You may have a bloody mucus discharge. This usually occurs a few days to a week before labor begins.   Your cervix becomes thin and soft (effaced) near your due date.  WHAT TO EXPECT AT YOUR PRENATAL EXAMS   You will have prenatal exams every 2 weeks until week 36. Then, you will have weekly prenatal exams. During a routine prenatal visit:   You will be weighed to make sure you and the fetus are growing normally.   Your blood pressure is taken.   Your abdomen will be  measured to track your baby's growth.   The fetal heartbeat will be listened to.   Any test results from the previous visit will be discussed.   You may have a cervical check near your due date to see if you have effaced.  At around 36 weeks, your caregiver will check your cervix. At the same time, your caregiver will also perform a test on the secretions of the vaginal tissue. This test is to determine if a type of bacteria, Group B streptococcus, is present. Your caregiver will explain this further.  Your caregiver may ask you:   What your birth plan is.   How you are feeling.   If you are feeling the baby move.   If you have had any abnormal symptoms, such as leaking fluid, bleeding, severe headaches, or abdominal cramping.   If you have any questions.  Other tests or screenings that may be performed during your third trimester include:   Blood tests that check for low iron levels (anemia).   Fetal testing to check the health, activity level, and growth of the fetus. Testing is done if you have certain medical conditions or if there are problems during the pregnancy.  FALSE LABOR  You may feel small, irregular contractions that eventually go away. These are called Braxton Hicks contractions, or   false labor. Contractions may last for hours, days, or even weeks before true labor sets in. If contractions come at regular intervals, intensify, or become painful, it is best to be seen by your caregiver.   SIGNS OF LABOR    Menstrual-like cramps.   Contractions that are 5 minutes apart or less.   Contractions that start on the top of the uterus and spread down to the lower abdomen and back.   A sense of increased pelvic pressure or back pain.   A watery or bloody mucus discharge that comes from the vagina.  If you have any of these signs before the 37th week of pregnancy, call your caregiver right away. You need to go to the hospital to get checked immediately.  HOME CARE INSTRUCTIONS    Avoid all  smoking, herbs, alcohol, and unprescribed drugs. These chemicals affect the formation and growth of the baby.   Follow your caregiver's instructions regarding medicine use. There are medicines that are either safe or unsafe to take during pregnancy.   Exercise only as directed by your caregiver. Experiencing uterine cramps is a good sign to stop exercising.   Continue to eat regular, healthy meals.   Wear a good support bra for breast tenderness.   Do not use hot tubs, steam rooms, or saunas.   Wear your seat belt at all times when driving.   Avoid raw meat, uncooked cheese, cat litter boxes, and soil used by cats. These carry germs that can cause birth defects in the baby.   Take your prenatal vitamins.   Try taking a stool softener (if your caregiver approves) if you develop constipation. Eat more high-fiber foods, such as fresh vegetables or fruit and whole grains. Drink plenty of fluids to keep your urine clear or pale yellow.   Take warm sitz baths to soothe any pain or discomfort caused by hemorrhoids. Use hemorrhoid cream if your caregiver approves.   If you develop varicose veins, wear support hose. Elevate your feet for 15 minutes, 3 4 times a day. Limit salt in your diet.   Avoid heavy lifting, wear low heal shoes, and practice good posture.   Rest a lot with your legs elevated if you have leg cramps or low back pain.   Visit your dentist if you have not gone during your pregnancy. Use a soft toothbrush to brush your teeth and be gentle when you floss.   A sexual relationship may be continued unless your caregiver directs you otherwise.   Do not travel far distances unless it is absolutely necessary and only with the approval of your caregiver.   Take prenatal classes to understand, practice, and ask questions about the labor and delivery.   Make a trial run to the hospital.   Pack your hospital bag.   Prepare the baby's nursery.   Continue to go to all your prenatal visits as directed  by your caregiver.  SEEK MEDICAL CARE IF:   You are unsure if you are in labor or if your water has broken.   You have dizziness.   You have mild pelvic cramps, pelvic pressure, or nagging pain in your abdominal area.   You have persistent nausea, vomiting, or diarrhea.   You have a bad smelling vaginal discharge.   You have pain with urination.  SEEK IMMEDIATE MEDICAL CARE IF:    You have a fever.   You are leaking fluid from your vagina.   You have spotting or bleeding from your vagina.     You have severe abdominal cramping or pain.   You have rapid weight loss or gain.   You have shortness of breath with chest pain.   You notice sudden or extreme swelling of your face, hands, ankles, feet, or legs.   You have not felt your baby move in over an hour.   You have severe headaches that do not go away with medicine.   You have vision changes.  Document Released: 09/14/2001 Document Revised: 05/23/2013 Document Reviewed: 11/21/2012  [employer] Patient Information 2014 [employer].

## 2013-09-18 LAB — GLUCOSE TOLERANCE, 2 HOURS
Glucose, 2 hour: 111 mg/dL (ref 70–139)
Glucose, Fasting: 76 mg/dL (ref 70–99)

## 2013-09-18 LAB — HSV 2 ANTIBODY, IGG: HSV 2 Glycoprotein G Ab, IgG: 0.82 IV

## 2013-09-18 LAB — ANTIBODY SCREEN: Antibody Screen: NEGATIVE

## 2013-09-18 LAB — HIV ANTIBODY (ROUTINE TESTING W REFLEX): HIV: NONREACTIVE

## 2013-09-19 LAB — GLUCOSE TOLERANCE, 1 HOUR: Glucose, Fasting: 76 mg/dL (ref 70–99)

## 2013-09-22 ENCOUNTER — Encounter: Payer: Self-pay | Admitting: Women's Health

## 2013-10-04 NOTE — L&D Delivery Note (Signed)
Delivery Note At 3:17 AM a healthy female was delivered via VBAC, Spontaneous (Presentation: Left Occiput Anterior).  APGAR: , ; weight .   Placenta status: Intact, Spontaneous.  Cord: 3 vessels with the following complications: None.  Cord pH: obtained and sent  Anesthesia: Epidural  Episiotomy: None Lacerations: Right lower labial tear Suture Repair: 3.0 Est. Blood Loss (mL):   Upon arrival she was 10 cm with excellent maternal effort for pushing that resulted in birth of healthy baby girl. Delivery was complicated by nuchal cord x 1 that was easily reduced. Baby was placed skin to skin with mother with delayed cord clamping and FOB cut the cord. Placenta was delivery intact with 3 vessels cord.  She had a right labial lip laceration that was repaired and was hemostatic when complete.   Mom to postpartum.  Baby to Couplet care / Skin to Skin.  Wenda LowJoyner, James 12/05/2013, 3:40 AM  I was present for the entire delivery and agree with resident's note and plan of care.  Tawana ScaleMichael Ryan Lolah Coghlan, MD OB Fellow 12/05/2013 4:13 AM

## 2013-10-15 ENCOUNTER — Ambulatory Visit (INDEPENDENT_AMBULATORY_CARE_PROVIDER_SITE_OTHER): Payer: BC Managed Care – PPO | Admitting: Obstetrics & Gynecology

## 2013-10-15 ENCOUNTER — Encounter: Payer: Self-pay | Admitting: Obstetrics & Gynecology

## 2013-10-15 VITALS — BP 110/60 | Wt 146.0 lb

## 2013-10-15 DIAGNOSIS — O34219 Maternal care for unspecified type scar from previous cesarean delivery: Secondary | ICD-10-CM

## 2013-10-15 DIAGNOSIS — Z3483 Encounter for supervision of other normal pregnancy, third trimester: Secondary | ICD-10-CM

## 2013-10-15 DIAGNOSIS — O99019 Anemia complicating pregnancy, unspecified trimester: Secondary | ICD-10-CM

## 2013-10-15 DIAGNOSIS — Z331 Pregnant state, incidental: Secondary | ICD-10-CM

## 2013-10-15 DIAGNOSIS — Z1389 Encounter for screening for other disorder: Secondary | ICD-10-CM

## 2013-10-15 LAB — POCT URINALYSIS DIPSTICK
Blood, UA: NEGATIVE
GLUCOSE UA: NEGATIVE
Ketones, UA: NEGATIVE
LEUKOCYTES UA: NEGATIVE
NITRITE UA: NEGATIVE

## 2013-10-15 NOTE — Progress Notes (Signed)
BP weight and urine results all reviewed and noted. Patient reports good fetal movement, denies any bleeding and no rupture of membranes symptoms or regular contractions. Patient is without complaints. All questions were answered. Sciatica left side

## 2013-10-22 ENCOUNTER — Telehealth: Payer: Self-pay | Admitting: *Deleted

## 2013-10-22 DIAGNOSIS — Z3480 Encounter for supervision of other normal pregnancy, unspecified trimester: Secondary | ICD-10-CM

## 2013-10-22 NOTE — Telephone Encounter (Signed)
Spoke with pt. She is requesting a prenatal vit that is covered by Medicaid. Uses [facility] in [place]. Thanks!!!

## 2013-10-23 MED ORDER — CITRANATAL ASSURE 300 MG PO MISC: ORAL | Status: DC

## 2013-10-23 NOTE — Telephone Encounter (Signed)
Left message letting pt know prenatal vits was sent to [facility] in [place]. JSY

## 2013-10-30 ENCOUNTER — Encounter: Payer: Self-pay | Admitting: Advanced Practice Midwife

## 2013-10-30 ENCOUNTER — Ambulatory Visit (INDEPENDENT_AMBULATORY_CARE_PROVIDER_SITE_OTHER): Payer: BC Managed Care – PPO | Admitting: Advanced Practice Midwife

## 2013-10-30 VITALS — BP 104/52 | Wt 149.0 lb

## 2013-10-30 DIAGNOSIS — Z1389 Encounter for screening for other disorder: Secondary | ICD-10-CM

## 2013-10-30 DIAGNOSIS — Z98891 History of uterine scar from previous surgery: Secondary | ICD-10-CM

## 2013-10-30 DIAGNOSIS — Z331 Pregnant state, incidental: Secondary | ICD-10-CM

## 2013-10-30 DIAGNOSIS — O34219 Maternal care for unspecified type scar from previous cesarean delivery: Secondary | ICD-10-CM

## 2013-10-30 DIAGNOSIS — O99019 Anemia complicating pregnancy, unspecified trimester: Secondary | ICD-10-CM

## 2013-10-30 LAB — POCT URINALYSIS DIPSTICK
Glucose, UA: 250
Ketones, UA: NEGATIVE
LEUKOCYTES UA: NEGATIVE
NITRITE UA: NEGATIVE
Protein, UA: 30
RBC UA: NEGATIVE

## 2013-10-30 NOTE — Progress Notes (Signed)
No c/o at this time.  Has occ BH contractions. Routine questions about pregnancy answered.  F/U in 2 weeks for LROB.  TOLAC consent signed.

## 2013-11-07 ENCOUNTER — Telehealth: Payer: Self-pay

## 2013-11-07 NOTE — Telephone Encounter (Signed)
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 Pt states ran a fever of 101.9 for one day.

## 2013-11-07 NOTE — Telephone Encounter (Signed)
Instructed pt that Fifth's disease is not an issue for pregnancy beyond about 14 weeks, and is likely she already has immunity anyway.  Pt reassured

## 2013-11-13 ENCOUNTER — Ambulatory Visit (INDEPENDENT_AMBULATORY_CARE_PROVIDER_SITE_OTHER): Payer: Self-pay | Admitting: Women's Health

## 2013-11-13 VITALS — BP 102/48 | Wt 148.6 lb

## 2013-11-13 DIAGNOSIS — O34219 Maternal care for unspecified type scar from previous cesarean delivery: Secondary | ICD-10-CM

## 2013-11-13 DIAGNOSIS — Z1389 Encounter for screening for other disorder: Secondary | ICD-10-CM

## 2013-11-13 DIAGNOSIS — Z331 Pregnant state, incidental: Secondary | ICD-10-CM

## 2013-11-13 DIAGNOSIS — O99019 Anemia complicating pregnancy, unspecified trimester: Secondary | ICD-10-CM

## 2013-11-13 DIAGNOSIS — Z3480 Encounter for supervision of other normal pregnancy, unspecified trimester: Secondary | ICD-10-CM

## 2013-11-13 DIAGNOSIS — O26849 Uterine size-date discrepancy, unspecified trimester: Secondary | ICD-10-CM

## 2013-11-13 LAB — POCT URINALYSIS DIPSTICK
Glucose, UA: NEGATIVE
Ketones, UA: NEGATIVE
Leukocytes, UA: NEGATIVE
Nitrite, UA: NEGATIVE
PROTEIN UA: NEGATIVE
RBC UA: NEGATIVE

## 2013-11-13 NOTE — Patient Instructions (Signed)
Third Trimester of Pregnancy  The third trimester is from week 29 through week 42, months 7 through 9. The third trimester is a time when the fetus is growing rapidly. At the end of the ninth month, the fetus is about 20 inches in length and weighs 6 10 pounds.   BODY CHANGES  Your body goes through many changes during pregnancy. The changes vary from woman to woman.    Your weight will continue to increase. You can expect to gain 25 35 pounds (11 16 kg) by the end of the pregnancy.   You may begin to get stretch marks on your hips, abdomen, and breasts.   You may urinate more often because the fetus is moving lower into your pelvis and pressing on your bladder.   You may develop or continue to have heartburn as a result of your pregnancy.   You may develop constipation because certain hormones are causing the muscles that push waste through your intestines to slow down.   You may develop hemorrhoids or swollen, bulging veins (varicose veins).   You may have pelvic pain because of the weight gain and pregnancy hormones relaxing your joints between the bones in your pelvis. Back aches may result from over exertion of the muscles supporting your posture.   Your breasts will continue to grow and be tender. A yellow discharge may leak from your breasts called colostrum.   Your belly button may stick out.   You may feel short of breath because of your expanding uterus.   You may notice the fetus "dropping," or moving lower in your abdomen.   You may have a bloody mucus discharge. This usually occurs a few days to a week before labor begins.   Your cervix becomes thin and soft (effaced) near your due date.  WHAT TO EXPECT AT YOUR PRENATAL EXAMS   You will have prenatal exams every 2 weeks until week 36. Then, you will have weekly prenatal exams. During a routine prenatal visit:   You will be weighed to make sure you and the fetus are growing normally.   Your blood pressure is taken.   Your abdomen will be  measured to track your baby's growth.   The fetal heartbeat will be listened to.   Any test results from the previous visit will be discussed.   You may have a cervical check near your due date to see if you have effaced.  At around 36 weeks, your caregiver will check your cervix. At the same time, your caregiver will also perform a test on the secretions of the vaginal tissue. This test is to determine if a type of bacteria, Group B streptococcus, is present. Your caregiver will explain this further.  Your caregiver may ask you:   What your birth plan is.   How you are feeling.   If you are feeling the baby move.   If you have had any abnormal symptoms, such as leaking fluid, bleeding, severe headaches, or abdominal cramping.   If you have any questions.  Other tests or screenings that may be performed during your third trimester include:   Blood tests that check for low iron levels (anemia).   Fetal testing to check the health, activity level, and growth of the fetus. Testing is done if you have certain medical conditions or if there are problems during the pregnancy.  FALSE LABOR  You may feel small, irregular contractions that eventually go away. These are called Braxton Hicks contractions, or   false labor. Contractions may last for hours, days, or even weeks before true labor sets in. If contractions come at regular intervals, intensify, or become painful, it is best to be seen by your caregiver.   SIGNS OF LABOR    Menstrual-like cramps.   Contractions that are 5 minutes apart or less.   Contractions that start on the top of the uterus and spread down to the lower abdomen and back.   A sense of increased pelvic pressure or back pain.   A watery or bloody mucus discharge that comes from the vagina.  If you have any of these signs before the 37th week of pregnancy, call your caregiver right away. You need to go to the hospital to get checked immediately.  HOME CARE INSTRUCTIONS    Avoid all  smoking, herbs, alcohol, and unprescribed drugs. These chemicals affect the formation and growth of the baby.   Follow your caregiver's instructions regarding medicine use. There are medicines that are either safe or unsafe to take during pregnancy.   Exercise only as directed by your caregiver. Experiencing uterine cramps is a good sign to stop exercising.   Continue to eat regular, healthy meals.   Wear a good support bra for breast tenderness.   Do not use hot tubs, steam rooms, or saunas.   Wear your seat belt at all times when driving.   Avoid raw meat, uncooked cheese, cat litter boxes, and soil used by cats. These carry germs that can cause birth defects in the baby.   Take your prenatal vitamins.   Try taking a stool softener (if your caregiver approves) if you develop constipation. Eat more high-fiber foods, such as fresh vegetables or fruit and whole grains. Drink plenty of fluids to keep your urine clear or pale yellow.   Take warm sitz baths to soothe any pain or discomfort caused by hemorrhoids. Use hemorrhoid cream if your caregiver approves.   If you develop varicose veins, wear support hose. Elevate your feet for 15 minutes, 3 4 times a day. Limit salt in your diet.   Avoid heavy lifting, wear low heal shoes, and practice good posture.   Rest a lot with your legs elevated if you have leg cramps or low back pain.   Visit your dentist if you have not gone during your pregnancy. Use a soft toothbrush to brush your teeth and be gentle when you floss.   A sexual relationship may be continued unless your caregiver directs you otherwise.   Do not travel far distances unless it is absolutely necessary and only with the approval of your caregiver.   Take prenatal classes to understand, practice, and ask questions about the labor and delivery.   Make a trial run to the hospital.   Pack your hospital bag.   Prepare the baby's nursery.   Continue to go to all your prenatal visits as directed  by your caregiver.  SEEK MEDICAL CARE IF:   You are unsure if you are in labor or if your water has broken.   You have dizziness.   You have mild pelvic cramps, pelvic pressure, or nagging pain in your abdominal area.   You have persistent nausea, vomiting, or diarrhea.   You have a bad smelling vaginal discharge.   You have pain with urination.  SEEK IMMEDIATE MEDICAL CARE IF:    You have a fever.   You are leaking fluid from your vagina.   You have spotting or bleeding from your vagina.     You have severe abdominal cramping or pain.   You have rapid weight loss or gain.   You have shortness of breath with chest pain.   You notice sudden or extreme swelling of your face, hands, ankles, feet, or legs.   You have not felt your baby move in over an hour.   You have severe headaches that do not go away with medicine.   You have vision changes.  Document Released: 09/14/2001 Document Revised: 05/23/2013 Document Reviewed: 11/21/2012  [employer] Patient Information 2014 [employer].

## 2013-11-13 NOTE — Progress Notes (Signed)
Reports good fm. Denies uc's, lof, vb, uti s/s.  LBP, cramping, 'miserable'. Discussed relief measures.  Partner planning on vasectomy in June, discussed contraception options for interim. Reviewed ptl s/s, fkc.  All questions answered. F/U in asap for growth u/s for s<d, then 2wks for lrob and gbs.

## 2013-11-16 ENCOUNTER — Ambulatory Visit (INDEPENDENT_AMBULATORY_CARE_PROVIDER_SITE_OTHER): Payer: BC Managed Care – PPO

## 2013-11-16 DIAGNOSIS — O26849 Uterine size-date discrepancy, unspecified trimester: Secondary | ICD-10-CM

## 2013-11-16 NOTE — Progress Notes (Signed)
U/S(35+3wks)-vtx active fetus, meas c/w dates, EFW 5 lb 11 oz (38th%tile), fluid wnl AFI-12.0cm, anterior Gr 2 placenta, FHR-138 bpm

## 2013-11-19 ENCOUNTER — Other Ambulatory Visit: Payer: BC Managed Care – PPO

## 2013-11-27 ENCOUNTER — Encounter: Payer: BC Managed Care – PPO | Admitting: Women's Health

## 2013-11-28 ENCOUNTER — Encounter: Payer: Self-pay | Admitting: Advanced Practice Midwife

## 2013-11-28 ENCOUNTER — Ambulatory Visit (INDEPENDENT_AMBULATORY_CARE_PROVIDER_SITE_OTHER): Payer: BC Managed Care – PPO | Admitting: Advanced Practice Midwife

## 2013-11-28 VITALS — BP 90/60 | Wt 152.0 lb

## 2013-11-28 DIAGNOSIS — O99019 Anemia complicating pregnancy, unspecified trimester: Secondary | ICD-10-CM

## 2013-11-28 DIAGNOSIS — O99330 Smoking (tobacco) complicating pregnancy, unspecified trimester: Secondary | ICD-10-CM

## 2013-11-28 DIAGNOSIS — O34219 Maternal care for unspecified type scar from previous cesarean delivery: Secondary | ICD-10-CM

## 2013-11-28 DIAGNOSIS — Z331 Pregnant state, incidental: Secondary | ICD-10-CM

## 2013-11-28 DIAGNOSIS — Z3480 Encounter for supervision of other normal pregnancy, unspecified trimester: Secondary | ICD-10-CM

## 2013-11-28 DIAGNOSIS — Z1389 Encounter for screening for other disorder: Secondary | ICD-10-CM

## 2013-11-28 DIAGNOSIS — O210 Mild hyperemesis gravidarum: Secondary | ICD-10-CM

## 2013-11-28 LAB — POCT URINALYSIS DIPSTICK
Glucose, UA: NEGATIVE
KETONES UA: NEGATIVE
Leukocytes, UA: NEGATIVE
Nitrite, UA: NEGATIVE
Protein, UA: NEGATIVE
RBC UA: NEGATIVE

## 2013-11-28 LAB — OB RESULTS CONSOLE GC/CHLAMYDIA
CHLAMYDIA, DNA PROBE: NEGATIVE
GC PROBE AMP, GENITAL: NEGATIVE

## 2013-11-28 NOTE — Progress Notes (Signed)
EFW 38% on 2/13. C/O sharp shooting vaginal pain.   GBS/GC/CHL collected. Husband may get vascetomy.  Doesn't want hormones.  Discussed paraguard  F/U 1 week Low-risk ob appt

## 2013-11-29 LAB — GC/CHLAMYDIA PROBE AMP
CT Probe RNA: NEGATIVE
GC Probe RNA: NEGATIVE

## 2013-11-30 LAB — STREP B DNA PROBE: STREP GROUP B AG: NEGATIVE

## 2013-12-04 ENCOUNTER — Inpatient Hospital Stay (HOSPITAL_COMMUNITY)
Admission: AD | Admit: 2013-12-04 | Discharge: 2013-12-06 | DRG: 775 | Disposition: A | Discharge status: Home / Self Care | Payer: BC Managed Care – PPO | Source: Ambulatory Visit | Attending: Obstetrics & Gynecology | Admitting: Obstetrics & Gynecology

## 2013-12-04 ENCOUNTER — Encounter (HOSPITAL_COMMUNITY): Payer: Self-pay

## 2013-12-04 ENCOUNTER — Encounter (HOSPITAL_COMMUNITY): Payer: BC Managed Care – PPO | Admitting: Anesthesiology

## 2013-12-04 ENCOUNTER — Inpatient Hospital Stay (HOSPITAL_COMMUNITY): Payer: BC Managed Care – PPO | Admitting: Anesthesiology

## 2013-12-04 DIAGNOSIS — Z283 Underimmunization status: Secondary | ICD-10-CM

## 2013-12-04 DIAGNOSIS — Z2839 Other underimmunization status: Secondary | ICD-10-CM

## 2013-12-04 DIAGNOSIS — O09899 Supervision of other high risk pregnancies, unspecified trimester: Secondary | ICD-10-CM

## 2013-12-04 DIAGNOSIS — O99334 Smoking (tobacco) complicating childbirth: Secondary | ICD-10-CM | POA: Diagnosis present

## 2013-12-04 DIAGNOSIS — Z3490 Encounter for supervision of normal pregnancy, unspecified, unspecified trimester: Secondary | ICD-10-CM

## 2013-12-04 DIAGNOSIS — Z98891 History of uterine scar from previous surgery: Secondary | ICD-10-CM

## 2013-12-04 DIAGNOSIS — Z3480 Encounter for supervision of other normal pregnancy, unspecified trimester: Secondary | ICD-10-CM

## 2013-12-04 DIAGNOSIS — O34219 Maternal care for unspecified type scar from previous cesarean delivery: Secondary | ICD-10-CM | POA: Diagnosis present

## 2013-12-04 DIAGNOSIS — O4290 Premature rupture of membranes, unspecified as to length of time between rupture and onset of labor, unspecified weeks of gestation: Principal | ICD-10-CM | POA: Diagnosis present

## 2013-12-04 LAB — CBC
HEMATOCRIT: 32.9 % — AB (ref 36.0–46.0)
Hemoglobin: 11.2 g/dL — ABNORMAL LOW (ref 12.0–15.0)
MCH: 29.4 pg (ref 26.0–34.0)
MCHC: 34 g/dL (ref 30.0–36.0)
MCV: 86.4 fL (ref 78.0–100.0)
Platelets: 256 10*3/uL (ref 150–400)
RBC: 3.81 MIL/uL — AB (ref 3.87–5.11)
RDW: 14.2 % (ref 11.5–15.5)
WBC: 11.7 10*3/uL — ABNORMAL HIGH (ref 4.0–10.5)

## 2013-12-04 LAB — TYPE AND SCREEN
ABO/RH(D): O POS
ANTIBODY SCREEN: NEGATIVE

## 2013-12-04 LAB — POCT FERN TEST: POCT Fern Test: POSITIVE

## 2013-12-04 LAB — URINALYSIS, ROUTINE W REFLEX MICROSCOPIC
Bilirubin Urine: NEGATIVE
GLUCOSE, UA: NEGATIVE mg/dL
Hgb urine dipstick: NEGATIVE
Ketones, ur: NEGATIVE mg/dL
Leukocytes, UA: NEGATIVE
Nitrite: NEGATIVE
PROTEIN: NEGATIVE mg/dL
Urobilinogen, UA: 0.2 mg/dL (ref 0.0–1.0)
pH: 6 (ref 5.0–8.0)

## 2013-12-04 LAB — HIV ANTIBODY (ROUTINE TESTING W REFLEX): HIV: NONREACTIVE

## 2013-12-04 LAB — RPR: RPR Ser Ql: NONREACTIVE

## 2013-12-04 MED ORDER — ZOLPIDEM TARTRATE 5 MG PO TABS
5.0000 mg | ORAL_TABLET | Freq: Every evening | ORAL | Status: DC | PRN
  Filled 2013-12-04: qty 1

## 2013-12-04 MED ORDER — TERBUTALINE SULFATE 1 MG/ML IJ SOLN: 0.2500 mg | Freq: Once | INTRAMUSCULAR | Status: AC | PRN

## 2013-12-04 MED ORDER — LIDOCAINE HCL (PF) 1 % IJ SOLN
30.0000 mL | INTRAMUSCULAR | Status: DC | PRN
  Administered 2013-12-05: 30 mL via SUBCUTANEOUS
  Filled 2013-12-04: qty 30

## 2013-12-04 MED ORDER — LIDOCAINE HCL (PF) 1 % IJ SOLN
INTRAMUSCULAR | Status: DC | PRN
  Administered 2013-12-04 (×3): 4 mL

## 2013-12-04 MED ORDER — SODIUM CHLORIDE 0.9 % IV SOLN
14.0000 mL/h | INTRAVENOUS | Status: DC
  Administered 2013-12-04 – 2013-12-05 (×2): 14 mL/h via EPIDURAL
  Filled 2013-12-04 (×3): qty 17

## 2013-12-04 MED ORDER — ONDANSETRON HCL 4 MG/2ML IJ SOLN: 4.0000 mg | Freq: Four times a day (QID) | INTRAMUSCULAR | Status: DC | PRN

## 2013-12-04 MED ORDER — PHENYLEPHRINE 40 MCG/ML (10ML) SYRINGE FOR IV PUSH (FOR BLOOD PRESSURE SUPPORT)
80.0000 ug | PREFILLED_SYRINGE | INTRAVENOUS | Status: DC | PRN
  Filled 2013-12-04: qty 2

## 2013-12-04 MED ORDER — EPHEDRINE 5 MG/ML INJ
10.0000 mg | INTRAVENOUS | Status: DC | PRN
  Filled 2013-12-04: qty 2

## 2013-12-04 MED ORDER — FENTANYL 2.5 MCG/ML BUPIVACAINE 1/10 % EPIDURAL INFUSION (WH - ANES): 14.0000 mL/h | INTRAMUSCULAR | Status: DC | PRN

## 2013-12-04 MED ORDER — EPHEDRINE 5 MG/ML INJ
INTRAVENOUS | Status: AC
  Filled 2013-12-04: qty 4

## 2013-12-04 MED ORDER — LACTATED RINGERS IV SOLN
INTRAVENOUS | Status: DC
  Administered 2013-12-04 – 2013-12-05 (×4): via INTRAVENOUS

## 2013-12-04 MED ORDER — OXYCODONE-ACETAMINOPHEN 5-325 MG PO TABS
1.0000 | ORAL_TABLET | ORAL | Status: DC | PRN
  Administered 2013-12-05: 1 via ORAL
  Filled 2013-12-04: qty 1

## 2013-12-04 MED ORDER — NALBUPHINE HCL 10 MG/ML IJ SOLN
10.0000 mg | INTRAMUSCULAR | Status: DC | PRN
  Filled 2013-12-04: qty 1

## 2013-12-04 MED ORDER — OXYTOCIN 40 UNITS IN LACTATED RINGERS INFUSION - SIMPLE MED: 62.5000 mL/h | INTRAVENOUS | Status: DC

## 2013-12-04 MED ORDER — LACTATED RINGERS IV SOLN
500.0000 mL | INTRAVENOUS | Status: DC | PRN
  Administered 2013-12-04 – 2013-12-05 (×2): 500 mL via INTRAVENOUS

## 2013-12-04 MED ORDER — IBUPROFEN 600 MG PO TABS
600.0000 mg | ORAL_TABLET | Freq: Four times a day (QID) | ORAL | Status: DC | PRN
  Administered 2013-12-05: 600 mg via ORAL
  Filled 2013-12-04: qty 1

## 2013-12-04 MED ORDER — OXYTOCIN BOLUS FROM INFUSION
500.0000 mL | INTRAVENOUS | Status: DC
  Administered 2013-12-05: 500 mL via INTRAVENOUS

## 2013-12-04 MED ORDER — FENTANYL 2.5 MCG/ML BUPIVACAINE 1/10 % EPIDURAL INFUSION (WH - ANES)
INTRAMUSCULAR | Status: AC
  Administered 2013-12-04: 12 mL/h via EPIDURAL
  Filled 2013-12-04: qty 125

## 2013-12-04 MED ORDER — ACETAMINOPHEN 325 MG PO TABS: 650.0000 mg | ORAL_TABLET | ORAL | Status: DC | PRN

## 2013-12-04 MED ORDER — MISOPROSTOL 25 MCG QUARTER TABLET: 50.0000 ug | ORAL_TABLET | ORAL | Status: DC

## 2013-12-04 MED ORDER — FLEET ENEMA 7-19 GM/118ML RE ENEM: 1.0000 | ENEMA | RECTAL | Status: DC | PRN

## 2013-12-04 MED ORDER — PHENYLEPHRINE 40 MCG/ML (10ML) SYRINGE FOR IV PUSH (FOR BLOOD PRESSURE SUPPORT)
PREFILLED_SYRINGE | INTRAVENOUS | Status: AC
  Filled 2013-12-04: qty 10

## 2013-12-04 MED ORDER — OXYTOCIN 40 UNITS IN LACTATED RINGERS INFUSION - SIMPLE MED
1.0000 m[IU]/min | INTRAVENOUS | Status: DC
  Administered 2013-12-04: 1 m[IU]/min via INTRAVENOUS
  Filled 2013-12-04: qty 1000

## 2013-12-04 MED ORDER — DIPHENHYDRAMINE HCL 50 MG/ML IJ SOLN: 12.5000 mg | INTRAMUSCULAR | Status: DC | PRN

## 2013-12-04 MED ORDER — LACTATED RINGERS IV SOLN: 500.0000 mL | Freq: Once | INTRAVENOUS | Status: DC

## 2013-12-04 MED ORDER — CITRIC ACID-SODIUM CITRATE 334-500 MG/5ML PO SOLN
30.0000 mL | ORAL | Status: DC | PRN
  Administered 2013-12-04: 30 mL via ORAL
  Filled 2013-12-04: qty 15

## 2013-12-04 NOTE — H&P (Signed)
Attestation of Attending Supervision of Fellow: Evaluation and management procedures were performed by the Fellow under my supervision and collaboration.  I have reviewed the Fellow's note and chart, and I agree with the management and plan.    

## 2013-12-04 NOTE — Anesthesia Preprocedure Evaluation (Signed)
Anesthesia Evaluation  Patient identified by MRN, date of birth, ID band Patient awake    Reviewed: Allergy & Precautions, H&P , NPO status , Patient's Chart, lab work & pertinent test results, reviewed documented beta blocker date and time   History of Anesthesia Complications Negative for: history of anesthetic complications  Airway Mallampati: I TM Distance: >3 FB Neck ROM: full    Dental  (+) Teeth Intact   Pulmonary neg pulmonary ROS, Current Smoker,  breath sounds clear to auscultation        Cardiovascular negative cardio ROS  Rhythm:regular Rate:Normal     Neuro/Psych negative neurological ROS  negative psych ROS   GI/Hepatic negative GI ROS, Neg liver ROS, IBS   Endo/Other  negative endocrine ROS  Renal/GU negative Renal ROS     Musculoskeletal   Abdominal   Peds  Hematology negative hematology ROS (+)   Anesthesia Other Findings   Reproductive/Obstetrics (+) Pregnancy (h/o c/s x1)                           Anesthesia Physical Anesthesia Plan  ASA: II  Anesthesia Plan: Epidural   Post-op Pain Management:    Induction:   Airway Management Planned:   Additional Equipment:   Intra-op Plan:   Post-operative Plan:   Informed Consent: I have reviewed the patients History and Physical, chart, labs and discussed the procedure including the risks, benefits and alternatives for the proposed anesthesia with the patient or authorized representative who has indicated his/her understanding and acceptance.     Plan Discussed with:   Anesthesia Plan Comments:         Anesthesia Quick Evaluation

## 2013-12-04 NOTE — Progress Notes (Signed)
  Subjective:  Pressure discomfort. Concerned how long labor can proceed before alternatives need to be considered.   Objective: BP 140/93  Pulse 87  Temp(Src) 99.3 F (37.4 C) (Axillary)  Resp 16  Ht 4\' 11"  (1.499 m)  Wt 69.4 kg (153 lb)  BMI 30.89 kg/m2  SpO2 100%  LMP 03/13/2013      FHT:  FHR: 135 bpm, variability: moderate,  accelerations:  Present,  decelerations:  Absent UC:   irregular, every 3-6 minutes SVE:   Dilation: 5.5 Effacement (%): 50 Station: -2 Exam by:: Roney MarionW. Muhammad CNM  Labs: Lab Results  Component Value Date   WBC 11.7* 12/04/2013   HGB 11.2* 12/04/2013   HCT 32.9* 12/04/2013   MCV 86.4 12/04/2013   PLT 256 12/04/2013    Assessment / Plan: Guadlupe SpanishMonica Leipold is a 23 y.o. G2P1001 at 181w0d by ultrasound admitted for PROM. This is TOLAC, proceeding normally. Due to slow progression, AROM was performed at 18:45 by Sid FalconWalidah Muhammad, CNM.   Labor: Progressing normally Preeclampsia:  N/A Fetal Wellbeing:  Category I Pain Control:  Epidural I/D:  n/a Anticipated MOD:  NSVD  Michaelene SongHall, Maron Stanzione C 12/04/2013, 7:32 PM

## 2013-12-04 NOTE — H&P (Signed)
[name] is a 23 y.o. female G2P1001 with IUP at 8120w0d presenting for PROM. Pt states she has been having none contractions, associated with none vaginal bleeding.  Membranes are intact, ruptured with active fetal movement.    [facility]  Prenatal History/Complications: None  Past Medical History: Past Medical History  Diagnosis Date  . HPV (human papilloma virus) infection   . Irritable bowel syndrome (IBS)   . Pregnant   . Abnormal Pap smear   . No pertinent past medical history    Past Surgical History: Past Surgical History  Procedure Laterality Date  . Wisdom tooth extraction    . Colonoscopy    . Cesarean section  08/03/2012    Procedure: CESAREAN SECTION;  Surgeon: Tilda BurrowJohn V Ferguson, MD;  Location: [facility];  Service: Obstetrics;  Laterality: N/A;   Obstetrical History: OB History   Grav Para Term Preterm Abortions TAB SAB Ect Mult Living   2 1 1  0 0 0 0 0 0 1     Social History: History   Social History  . Marital Status: Married    Spouse Name: N/A    Number of Children: N/A  . Years of Education: N/A   Social History Main Topics  . Smoking status: Current Every Day Smoker -- 0.25 packs/day for 7 years    Types: Cigarettes  . Smokeless tobacco: Never Used  . Alcohol Use: No  . Drug Use: No  . Sexual Activity: Yes    Birth Control/ Protection: None   Other Topics Concern  . None   Social History Narrative  . None    Family History: Family History  Problem Relation Age of Onset  . Other Neg Hx   . Miscarriages / [place] Mother   . Heart disease Maternal Grandmother   . Cancer Maternal Grandfather     prostate  . Diabetes Paternal Grandmother     Allergies: Allergies  Allergen Reactions  . Sulfa Drugs Cross Reactors Hives    Prescriptions prior to admission  Medication Sig Dispense Refill  . acetaminophen (TYLENOL) 325 MG tablet Take 650 mg by mouth as needed.      . Docosahexaenoic Acid (PRENATAL DHA) 200 MG  CAPS Take 1 capsule (200 mg total) by mouth daily.  30 capsule  11    Review of Systems: Negative unless otherwise stated in History above  Physicial Blood pressure 125/78, pulse 99, temperature 98.1 F (36.7 C), temperature source Oral, resp. rate 18, last menstrual period 03/13/2013, SpO2 99.00%. General appearance: alert, cooperative and no distress Lungs: clear to auscultation bilaterally Heart: regular rate and rhythm Abdomen: soft, non-tender; bowel sounds normal; Gravid Extremities: Homans sign is negative, no sign of DVT Presentation: cephalic Fetal monitoringBaseline: 130 bpm, Variability: Good {> 6 bpm), Accelerations: Reactive and Decelerations: Absent Uterine activityNone Dilation: 1 Effacement (%): Thick Station: -3 Exam by:: Dr Reola CalkinsBeck   Prenatal labs: ABO, Rh: --/--/O POS (12/01 0610) Antibody: NEG (12/15 0913) Rubella:   RPR: NON REAC (12/15 0913)  HBsAg: NEGATIVE (07/29 0949)  HIV: NON REACTIVE (12/15 0913)  GBS: NEGATIVE (02/25 1102)  GTT: wnl  Prenatal Transfer Tool  Maternal Diabetes: No Genetic Screening: Declined Maternal Ultrasounds/Referrals: Normal Fetal Ultrasounds or other Referrals:  None Maternal Substance Abuse:  Yes:  Type: Smoker Significant Maternal Medications:  None Significant Maternal Lab Results: None  No results found for this or any previous visit (from the past 24 hour(s)).  Assessment: [name] is a 23 y.o.  G2P1001 at [redacted]w[redacted]d by here for PROM.   #Labor: hx of c/s so will attempt to avoid cytotec. Will place FB on the floor.  #Pain: Epidural on request #FWB: Cat 1 #ID: GBS Negative # Feeding: Desire to breastfeed, but difficulties with first baby # MOC: Considering Copper IUD # Circ: NA - female  Wenda Low MD [facility] PGY-1 12/04/2013, 1:17 AM  I have seen and examined this patient and agree with above documentation in the resident's note. Pt is a G2P1001 @ [redacted]w[redacted]d here for ROM at 11pm. Unfavorable bishops and TOLAC.  Will place FB.  FWB- cat I tracing  Rulon Abide, M.D. [facility] Fellow 12/04/2013 1:48 AM

## 2013-12-04 NOTE — MAU Note (Signed)
Pt states uc began around 11pm. Denies vaginal bleeding. States she lost some fluid, but does not know if her water broke. +FM. No problems with pregnancy

## 2013-12-04 NOTE — Progress Notes (Signed)
   Subjective: Pt reports comfortable after epidural.  No questions or concerns.    Objective: BP 116/67  Pulse 89  Temp(Src) 98.1 F (36.7 C) (Axillary)  Resp 16  Ht 4\' 11"  (1.499 m)  Wt 69.4 kg (153 lb)  BMI 30.89 kg/m2  SpO2 100%  LMP 03/13/2013      FHT:  FHR: 130's bpm, variability: moderate,  accelerations:  Present,  decelerations:  Absent UC:   irregular SVE:   Dilation: 3 Effacement (%): 80 Station: -3 Exam by:: Roney MarionW. Muhammad, CNM Foley bulb palpated behind cervix. Pitocin on at 4 mu/min  Labs: Lab Results  Component Value Date   WBC 11.7* 12/04/2013   HGB 11.2* 12/04/2013   HCT 32.9* 12/04/2013   MCV 86.4 12/04/2013   PLT 256 12/04/2013    Assessment / Plan: Augmentation of labor, progressing well  Labor: Progressing normally Preeclampsia:  n/a Fetal Wellbeing:  Category I Pain Control:  Epidural I/D:  GBS neg Anticipated MOD:  NSVD  [facility],WALIDAH 12/04/2013, 2:24 PM

## 2013-12-04 NOTE — Progress Notes (Signed)
   Subjective: Pt reports some pain and cramping.  No requests for pain medications.    Objective: BP 113/64  Pulse 80  Temp(Src) 97.1 F (36.2 C) (Oral)  Resp 16  Ht 4\' 11"  (1.499 m)  Wt 69.4 kg (153 lb)  BMI 30.89 kg/m2  SpO2 99%  LMP 03/13/2013      FHT:  FHR: 120's bpm, variability: moderate,  accelerations:  Present,  decelerations:  Absent UC:   irregular, every not tracing all  Foley bulb tugged > almost out  Labs: Lab Results  Component Value Date   WBC 11.7* 12/04/2013   HGB 11.2* 12/04/2013   HCT 32.9* 12/04/2013   MCV 86.4 12/04/2013   PLT 256 12/04/2013    Assessment / Plan: Augmentation of labor, progressing well  Labor: Progressing normally Preeclampsia:  n/a Fetal Wellbeing:  Category I Pain Control:  Labor support without medications I/D:  GBS neg Anticipated MOD:  NSVD Begin low dose pitocin  [facility],Monterrius Cardosa 12/04/2013, 10:10 AM

## 2013-12-04 NOTE — Progress Notes (Signed)
Guadlupe SpanishMonica Vanputten is a 23 y.o. G2P1001 at 514w0d  admitted for PROM  Subjective:  Comfortable. Still leaking clear fluid. +FM.   Objective: BP 120/64  Pulse 87  Temp(Src) 98.5 F (36.9 C) (Oral)  Resp 18  Ht 4\' 11"  (1.499 m)  Wt 69.4 kg (153 lb)  BMI 30.89 kg/m2  SpO2 99%  LMP 03/13/2013      FHT:  FHR: 130 bpm, variability: moderate,  accelerations:  Present,  decelerations:  Absent UC:   irritability SVE:   Dilation: 1 Effacement (%): Thick Station: -3 Exam by:: Dr Reola CalkinsBeck  Labs: Lab Results  Component Value Date   WBC 11.7* 12/04/2013   HGB 11.2* 12/04/2013   HCT 32.9* 12/04/2013   MCV 86.4 12/04/2013   PLT 256 12/04/2013    Assessment / Plan: PROM  Labor: FB placed and inflated iwth 60cc of LR. no complications Fetal Wellbeing:  Category I Pain Control:  Labor support without medications I/D:  n/a Anticipated MOD:  NSVD  Dietrich Ke L 12/04/2013, 4:14 AM

## 2013-12-04 NOTE — Anesthesia Procedure Notes (Signed)
Epidural Patient location during procedure: OB Start time: 12/04/2013 1:00 PM  Staffing Performed by: anesthesiologist   Preanesthetic Checklist Completed: patient identified, site marked, surgical consent, pre-op evaluation, timeout performed, IV checked, risks and benefits discussed and monitors and equipment checked  Epidural Patient position: sitting Prep: site prepped and draped and DuraPrep Patient monitoring: continuous pulse ox and blood pressure Approach: midline Injection technique: LOR air  Needle:  Needle type: Tuohy  Needle gauge: 17 G Needle length: 9 cm and 9 Needle insertion depth: 5.5 cm Catheter type: closed end flexible Catheter size: 19 Gauge Catheter at skin depth: 10.5 cm Test dose: negative  Assessment Events: blood not aspirated, injection not painful, no injection resistance, negative IV test and no paresthesia  Additional Notes Discussed risk of headache, infection, bleeding, nerve injury and failed or incomplete block.  Patient voices understanding and wishes to proceed.  Epidural placed easily on first attempt.  No paresthesia.  Patient tolerated procedure well with no apparent complications.  [name], MDReason for block:procedure for pain

## 2013-12-04 NOTE — MAU Note (Signed)
Dr Gayla DossJoyner in to to speculum exam to r/o SROM

## 2013-12-04 NOTE — Progress Notes (Addendum)
[name] is a 23 y.o. G2P1001 at 4876w0d admitted for PROM  Subjective: Pain well controlled by epidural.   Objective: BP 97/59  Pulse 96  Temp(Src) 98.6 F (37 C) (Axillary)  Resp 18  Ht 4\' 11"  (1.499 m)  Wt 69.4 kg (153 lb)  BMI 30.89 kg/m2  SpO2 100%  LMP 03/13/2013    FHT:  FHR: 140 bpm, variability: moderate,  accelerations:  Abscent,  decelerations:  Present Several late decels after insertion of IUPC that resolved  UC:   regular, every 3 minutes SVE:   Dilation: 6 Effacement (%): 50 Station: -1;0 Exam by:: Dr Ike Benedom  Labs: Lab Results  Component Value Date   WBC 11.7* 12/04/2013   HGB 11.2* 12/04/2013   HCT 32.9* 12/04/2013   MCV 86.4 12/04/2013   PLT 256 12/04/2013   Assessment / Plan: Augmentation of labor, progressing well  Labor: Progressing on Pit, now with IUPC Fetal Wellbeing:  Category II Pain Control:  Epidural I/D:  GBS neg Anticipated MOD:  NSVD  [name], [name] 12/04/2013, 11:24 PM  FWB: reposition, fluid bolus, and O2. Pit held stable and improved FHT. Will continue to monitor. Labor: S/p IUPC minimal change and now having documented adequate contractions. Will recheck in 4 hours if no change with adequate contractions, will discuss alternative delivery modality.  I spoke with and examined patient and agree with resident's note and plan of care.  Tawana ScaleMichael Ryan Kizzie Cotten, MD OB Fellow 12/04/2013 11:40 PM

## 2013-12-05 ENCOUNTER — Encounter: Payer: BC Managed Care – PPO | Admitting: Women's Health

## 2013-12-05 ENCOUNTER — Encounter (HOSPITAL_COMMUNITY): Payer: Self-pay | Admitting: Obstetrics

## 2013-12-05 DIAGNOSIS — O4290 Premature rupture of membranes, unspecified as to length of time between rupture and onset of labor, unspecified weeks of gestation: Secondary | ICD-10-CM

## 2013-12-05 DIAGNOSIS — O99334 Smoking (tobacco) complicating childbirth: Secondary | ICD-10-CM

## 2013-12-05 MED ORDER — SENNOSIDES-DOCUSATE SODIUM 8.6-50 MG PO TABS
2.0000 | ORAL_TABLET | ORAL | Status: DC
  Administered 2013-12-05: 2 via ORAL
  Filled 2013-12-05: qty 2

## 2013-12-05 MED ORDER — ONDANSETRON HCL 4 MG/2ML IJ SOLN: 4.0000 mg | INTRAMUSCULAR | Status: DC | PRN

## 2013-12-05 MED ORDER — ZOLPIDEM TARTRATE 5 MG PO TABS: 5.0000 mg | ORAL_TABLET | Freq: Every evening | ORAL | Status: DC | PRN

## 2013-12-05 MED ORDER — IBUPROFEN 600 MG PO TABS
600.0000 mg | ORAL_TABLET | Freq: Four times a day (QID) | ORAL | Status: DC
  Administered 2013-12-05 – 2013-12-06 (×5): 600 mg via ORAL
  Filled 2013-12-05 (×5): qty 1

## 2013-12-05 MED ORDER — OXYCODONE-ACETAMINOPHEN 5-325 MG PO TABS
1.0000 | ORAL_TABLET | ORAL | Status: DC | PRN
Start: 2013-12-05 — End: 2013-12-06
  Administered 2013-12-05: 1 via ORAL
  Administered 2013-12-05 – 2013-12-06 (×4): 2 via ORAL
  Filled 2013-12-05: qty 1
  Filled 2013-12-05 (×4): qty 2

## 2013-12-05 MED ORDER — BENZOCAINE-MENTHOL 20-0.5 % EX AERO
1.0000 "application " | INHALATION_SPRAY | CUTANEOUS | Status: DC | PRN
  Administered 2013-12-05: 1 via TOPICAL
  Filled 2013-12-05: qty 56

## 2013-12-05 MED ORDER — DIBUCAINE 1 % RE OINT: 1.0000 "application " | TOPICAL_OINTMENT | RECTAL | Status: DC | PRN

## 2013-12-05 MED ORDER — ONDANSETRON HCL 4 MG PO TABS: 4.0000 mg | ORAL_TABLET | ORAL | Status: DC | PRN

## 2013-12-05 MED ORDER — DIPHENHYDRAMINE HCL 25 MG PO CAPS
25.0000 mg | ORAL_CAPSULE | Freq: Four times a day (QID) | ORAL | Status: DC | PRN
Start: 2013-12-05 — End: 2013-12-06

## 2013-12-05 MED ORDER — LANOLIN HYDROUS EX OINT: TOPICAL_OINTMENT | CUTANEOUS | Status: DC | PRN

## 2013-12-05 MED ORDER — SIMETHICONE 80 MG PO CHEW: 80.0000 mg | CHEWABLE_TABLET | ORAL | Status: DC | PRN

## 2013-12-05 MED ORDER — PRENATAL MULTIVITAMIN CH
1.0000 | ORAL_TABLET | Freq: Every day | ORAL | Status: DC
  Administered 2013-12-05 – 2013-12-06 (×2): 1 via ORAL
  Filled 2013-12-05 (×2): qty 1

## 2013-12-05 MED ORDER — TETANUS-DIPHTH-ACELL PERTUSSIS 5-2.5-18.5 LF-MCG/0.5 IM SUSP
0.5000 mL | Freq: Once | INTRAMUSCULAR | Status: DC
Start: 2013-12-06 — End: 2013-12-06

## 2013-12-05 MED ORDER — WITCH HAZEL-GLYCERIN EX PADS: 1.0000 "application " | MEDICATED_PAD | CUTANEOUS | Status: DC | PRN

## 2013-12-05 NOTE — Lactation Note (Signed)
This note was copied from the chart of Girl Guadlupe SpanishMonica Corsi. Lactation Consultation Note  Patient Name: Girl Guadlupe SpanishMonica Baldwin ZOXWR'UToday's Date: 12/05/2013 Reason for consult: Initial assessment Assisted Mom with latching baby in cross cradle. Assisted Mom in obtaining good depth with latch. Mom stopped BF with 1st baby after 3 weeks due to nipple pain. Stressed importance of good positioning and deep latch to prevent trauma and promote milk transfer. BF basics reviewed. Lactation brochure left for review. Advised of OP services and support group. Encouraged Mom to call for assist as needed.   Maternal Data Formula Feeding for Exclusion: No Infant to breast within first hour of birth: Yes Has patient been taught Hand Expression?: Yes Does the patient have breastfeeding experience prior to this delivery?: Yes  Feeding Feeding Type: Breast Fed Length of feed: 10 min  LATCH Score/Interventions Latch: Repeated attempts needed to sustain latch, nipple held in mouth throughout feeding, stimulation needed to elicit sucking reflex. Intervention(s): Adjust position;Assist with latch;Breast massage;Breast compression  Audible Swallowing: A few with stimulation  Type of Nipple: Everted at rest and after stimulation (short nipple shaft)  Comfort (Breast/Nipple): Soft / non-tender     Hold (Positioning): Assistance needed to correctly position infant at breast and maintain latch. Intervention(s): Breastfeeding basics reviewed;Support Pillows;Position options;Skin to skin  LATCH Score: 7  Lactation Tools Discussed/Used WIC Program: Yes   Consult Status Consult Status: Follow-up Date: 12/06/13 Follow-up type: In-patient    Alfred LevinsGranger, Allayna Erlich Ann 12/05/2013, 6:24 PM

## 2013-12-05 NOTE — Progress Notes (Signed)
K pad started for back pain from epidural that motrin and percocet did not seem to stop.

## 2013-12-05 NOTE — Anesthesia Postprocedure Evaluation (Signed)
Anesthesia Post Note  Patient: [name]  Procedure(s) Performed: * No procedures listed *  Anesthesia type: Epidural  Patient location: Mother/Baby  Post pain: Pain level controlled  Post assessment: Post-op Vital signs reviewed  Last Vitals:  Filed Vitals:   12/05/13 0648  BP: 113/55  Pulse: 92  Temp: 37 C  Resp: 16    Post vital signs: Reviewed  Level of consciousness:alert  Complications: No apparent anesthesia complications

## 2013-12-06 MED ORDER — OXYCODONE-ACETAMINOPHEN 5-325 MG PO TABS: 1.0000 | ORAL_TABLET | ORAL | Status: DC | PRN

## 2013-12-06 MED ORDER — IBUPROFEN 600 MG PO TABS: 600.0000 mg | ORAL_TABLET | Freq: Four times a day (QID) | ORAL | Status: DC

## 2013-12-06 NOTE — Discharge Instructions (Signed)

## 2013-12-06 NOTE — Lactation Note (Signed)
This note was copied from the chart of Brandi Guadlupe SpanishMonica Foulk. Lactation Consultation Note  Patient Name: [name] OZHYQ'MToday's Date: 12/06/2013 Reason for consult: Follow-up assessment;Breast/nipple pain Bilateral redness to center of nipples. Tender. Flat w/compressable areola. Has shells, encouraged to wear them. Gave to mom to apply in bra w/proper positioning. Noted baby slight jaundice w/WDL TCB. Encouraged feeding as well to prevent jaundice. Demonstrated chin repositioning to obtain a deeper latch. Discussed position options, engorgement and prevention. Comfort gels given for soreness, encouraged to wear shelld to evert nipple more. Baby demonstrated good swallows and deep latch. [facility] encouraged for assistance.  Maternal Data    Feeding Feeding Type: Breast Fed Length of feed: 10 min  LATCH Score/Interventions Latch: Repeated attempts needed to sustain latch, nipple held in mouth throughout feeding, stimulation needed to elicit sucking reflex. Intervention(s): Adjust position;Assist with latch;Breast massage;Breast compression  Audible Swallowing: Spontaneous and intermittent Intervention(s): Skin to skin;Hand expression;Alternate breast massage  Type of Nipple: Flat (compressable areola) Intervention(s): Shells;Hand pump  Comfort (Breast/Nipple): Filling, red/small blisters or bruises, mild/mod discomfort  Problem noted: Filling;Mild/Moderate discomfort Interventions (Filling): Massage;Firm support;Hand pump Interventions (Mild/moderate discomfort): Comfort gels  Hold (Positioning): Assistance needed to correctly position infant at breast and maintain latch. Intervention(s): Breastfeeding basics reviewed;Support Pillows;Position options;Skin to skin  LATCH Score: 6  Lactation Tools Discussed/Used Tools: Shells;Comfort gels Shell Type: Inverted   Consult Status Consult Status: Complete Date: 12/06/13 Follow-up type: In-patient    Charyl DancerCARVER, Jaquia Benedicto  G 12/06/2013, 11:48 AM

## 2013-12-06 NOTE — Discharge Summary (Signed)
Obstetric Discharge Summary Reason for Admission: rupture of membranes Prenatal Procedures: none Intrapartum Procedures: spontaneous vaginal delivery Postpartum Procedures: none Complications-Operative and Postpartum: Right vaginal labia tear Hemoglobin  Date Value Ref Range Status  12/04/2013 11.2* 12.0 - 15.0 g/dL Final     HCT  Date Value Ref Range Status  12/04/2013 32.9* 36.0 - 46.0 % Final    Discharge Diagnoses: Term Pregnancy-delivered  Hospital Course:  [name] is a 23 y.o. [id number] who presented with PROM.  She had a uncomplicated SVD. She was able to ambulate, tolerate PO and void normally. She was discharged home with instructions for postpartum care.    Delivery Note At 3:17 AM a healthy female was delivered via VBAC, Spontaneous (Presentation: Left Occiput Anterior).  APGAR: 9, 9; weight 6 lb 1.2 oz (2755 g).   Placenta status: Intact, Spontaneous.  Cord: 3 vessels with the following complications: None.  Cord pH: obtained and sent  Anesthesia: Epidural  Episiotomy: None Lacerations:  Suture Repair: 3.0 Monocryl Est. Blood Loss (mL): 300  Upon arrival she was 10 cm with excellent maternal effort for pushing that resulted in birth of healthy baby girl. Delivery was complicated by nuchal cord x 1 that was easily reduced. Baby was placed skin to skin with mother with delayed cord clamping and FOB cut the cord. Placenta was delivery intact with 3 vessels cord. She had a right labial lip laceration that was repaired and was hemostatic when complete.   Mom to postpartum. Baby to Couplet care / Skin to Skin.  Physical Exam:  General: alert, cooperative and no distress Lochia: appropriate Uterine Fundus: firm DVT Evaluation: No evidence of DVT seen on physical exam. No significant calf/ankle edema.  Discharge Information: Date: 12/06/2013 Activity: pelvic rest Diet: routine Medications: Ibuprofen and Percocet Baby feeding: plans to breastfeed Contraception: condoms,  IUD Condition: stable Instructions: refer to practice specific booklet Discharge to: home   Newborn Data: Live born female  Birth Weight: 6 lb 1.2 oz (2755 g) APGAR: 9, 9  Home with mother.  Wenda LowJames Joyner, MD [facility] PGY-1 12/06/2013, 6:11 AM  I spoke with and examined patient and agree with resident's note and plan of care.  Tawana ScaleMichael Ryan Imagine Nest, MD OB Fellow 12/06/2013 8:16 AM

## 2013-12-10 ENCOUNTER — Telehealth: Payer: Self-pay | Admitting: Women's Health

## 2013-12-10 ENCOUNTER — Encounter: Payer: Self-pay | Admitting: Obstetrics & Gynecology

## 2013-12-10 ENCOUNTER — Ambulatory Visit (INDEPENDENT_AMBULATORY_CARE_PROVIDER_SITE_OTHER): Payer: BC Managed Care – PPO | Admitting: Obstetrics & Gynecology

## 2013-12-10 VITALS — BP 110/70 | Wt 142.0 lb

## 2013-12-10 DIAGNOSIS — O909 Complication of the puerperium, unspecified: Secondary | ICD-10-CM

## 2013-12-10 DIAGNOSIS — S3141XA Laceration without foreign body of vagina and vulva, initial encounter: Secondary | ICD-10-CM

## 2013-12-10 MED ORDER — OXYCODONE-ACETAMINOPHEN 5-325 MG PO TABS: 1.0000 | ORAL_TABLET | ORAL | Status: DC | PRN

## 2013-12-10 MED ORDER — LIDOCAINE HCL 2 % EX GEL: 1.0000 "application " | CUTANEOUS | Status: DC | PRN

## 2013-12-10 NOTE — Telephone Encounter (Signed)
Pt states that she delivered on the 4th, vaginal delivery, and tore. Pt states that she is constipated, she has taken miralax which did help. Pt states that she is having so much pain. Pt states that she has a "tear on her outer lip", she states that "it is in 2 pieces". Pt was given an appointment for this afternoon at 2:30 with Dr. Despina HiddenEure.

## 2013-12-10 NOTE — Progress Notes (Signed)
Patient ID: [name], female   DOB: [birth year], 23 y.o.   MRN: [id number] Pt is 5 days post partum from vaginal delivery Has a right labial tear which is healing and quite tender  Rx  Percocet and lidocine jelly 2%  Keep pp appt

## 2013-12-10 NOTE — Discharge Summary (Signed)
Attestation of Attending Supervision of Fellow: Evaluation and management procedures were performed by the Fellow under my supervision and collaboration.  I have reviewed the Fellow's note and chart, and I agree with the management and plan.    

## 2013-12-11 ENCOUNTER — Telehealth: Payer: Self-pay | Admitting: *Deleted

## 2013-12-11 NOTE — Telephone Encounter (Signed)
Pt wanted to know if her back pain, throbbing pain, could be coming from her baby having A negative blood and her having O positive. Pt states that the pain started as soon as she got home from the hospital. Pt states that she did have an epidural. Can this be causing her pain.

## 2013-12-12 NOTE — Telephone Encounter (Signed)
Pt aware of Kim's advice.

## 2014-01-02 ENCOUNTER — Ambulatory Visit (INDEPENDENT_AMBULATORY_CARE_PROVIDER_SITE_OTHER): Payer: BC Managed Care – PPO | Admitting: Advanced Practice Midwife

## 2014-01-02 ENCOUNTER — Encounter: Payer: Self-pay | Admitting: Advanced Practice Midwife

## 2014-01-02 NOTE — Progress Notes (Signed)
  [name] is a 23 y.o. who presents for a postpartum visit. She is 4 weeks postpartum following a VBAC. I have fully reviewed the prenatal and intrapartum course. The delivery was at 38.3 gestational weeks.  Anesthesia: epidural. Postpartum course has been uneventful. Baby's course has been uneventful. Baby is feeding by breast. Bleeding: staining only. Bowel function is normal. Bladder function is normal. Patient is sexually active. Contraception method is condoms. Postpartum depression screening: negative. C/O ingrown pubic hair that inflames off and on for 5 years; has had it i&d's before, stating it tracked down about 2cms, was packed, but never cleared up.  Squeezed it last night and some pus came out.   Review of Systems   Constitutional: Negative for fever and chills Eyes: Negative for visual disturbances Respiratory: Negative for shortness of breath, dyspnea Cardiovascular: Negative for chest pain or palpitations  Gastrointestinal: Negative for vomiting, diarrhea and constipation Genitourinary: Negative for dysuria and urgency Musculoskeletal: Negative for back pain, joint pain, myalgias  Neurological: Negative for dizziness and headaches   Objective:     Filed Vitals:   01/02/14 1055  BP: 98/60   General:  alert, cooperative and no distress   Breasts:  negative  Lungs: clear to auscultation bilaterally  Heart:  regular rate and rhythm  Abdomen: Soft, nontender   Vulva:  Left majora, 1 cm cystic area  Vagina: normal vagina  Cervix:  closed  Corpus: Well involuted     Rectal Exam: no hemorrhoids        Assessment:    normal postpartum exam.  Plan:    1. Contraception: IUD 2. Follow up in: tomorrow for I&D furuncle; 2 weeks for IUD or as needed.

## 2014-01-03 ENCOUNTER — Ambulatory Visit (INDEPENDENT_AMBULATORY_CARE_PROVIDER_SITE_OTHER): Payer: BC Managed Care – PPO | Admitting: Advanced Practice Midwife

## 2014-01-03 ENCOUNTER — Encounter: Payer: Self-pay | Admitting: Advanced Practice Midwife

## 2014-01-03 ENCOUNTER — Encounter (INDEPENDENT_AMBULATORY_CARE_PROVIDER_SITE_OTHER): Payer: Self-pay

## 2014-01-03 VITALS — BP 108/62 | Ht 59.0 in | Wt 131.0 lb

## 2014-01-03 DIAGNOSIS — N764 Abscess of vulva: Secondary | ICD-10-CM

## 2014-01-03 MED ORDER — CLINDAMYCIN HCL 300 MG PO CAPS: 300.0000 mg | ORAL_CAPSULE | Freq: Three times a day (TID) | ORAL | Status: DC

## 2014-01-03 NOTE — Progress Notes (Signed)
[name] 23 y.o.  Filed Vitals:   01/03/14 1042  BP: 108/62   Past Medical History  Diagnosis Date  . HPV (human papilloma virus) infection   . Irritable bowel syndrome (IBS)   . Pregnant   . Abnormal Pap smear   . No pertinent past medical history    Past Surgical History  Procedure Laterality Date  . Wisdom tooth extraction    . Colonoscopy    . Cesarean section  08/03/2012    Procedure: CESAREAN SECTION;  Surgeon: Tilda BurrowJohn V Ferguson, MD;  Location: [facility];  Service: Obstetrics;  Laterality: N/A;   family history includes Cancer in her maternal grandfather; Diabetes in her paternal grandmother; Heart disease in her maternal grandmother; Miscarriages / [place] in her mother. There is no history of Other. Current outpatient prescriptions:ibuprofen (ADVIL,MOTRIN) 600 MG tablet, Take 1 tablet (600 mg total) by mouth every 6 (six) hours., Disp: 30 tablet, Rfl: 0;  oxyCODONE-acetaminophen (PERCOCET/ROXICET) 5-325 MG per tablet, Take 1-2 tablets by mouth every 4 (four) hours as needed for severe pain (moderate - severe pain)., Disp: 30 tablet, Rfl: 0;  acetaminophen (TYLENOL) 325 MG tablet, Take 650 mg by mouth as needed., Disp: , Rfl:  Docosahexaenoic Acid (PRENATAL DHA) 200 MG CAPS, Take 1 capsule (200 mg total) by mouth daily., Disp: 30 capsule, Rfl: 11;  lidocaine (XYLOCAINE JELLY) 2 % jelly, Apply 1 application topically as needed., Disp: 30 mL, Rfl: 0  HPI: Has been dealing with off and on furuncle/cyst on vulva for 5 years; it has been I&D's several times.    PE: less inflammed today than yesterday  ASSESSMENT:chronic vulvar cyst  PLAN:discussed with [facility];  Recommended cooling it down with abx for 2 weeks, then removing entire capsule.  Clindamycin 300mg  TID for 2 weeks

## 2014-01-16 ENCOUNTER — Ambulatory Visit (INDEPENDENT_AMBULATORY_CARE_PROVIDER_SITE_OTHER): Payer: BC Managed Care – PPO | Admitting: Advanced Practice Midwife

## 2014-01-16 ENCOUNTER — Ambulatory Visit: Payer: BC Managed Care – PPO | Admitting: Advanced Practice Midwife

## 2014-01-16 ENCOUNTER — Encounter: Payer: Self-pay | Admitting: Advanced Practice Midwife

## 2014-01-16 VITALS — BP 100/64 | Ht 59.0 in | Wt 132.0 lb

## 2014-01-16 DIAGNOSIS — Z30430 Encounter for insertion of intrauterine contraceptive device: Secondary | ICD-10-CM

## 2014-01-16 DIAGNOSIS — Z3202 Encounter for pregnancy test, result negative: Secondary | ICD-10-CM | POA: Insufficient documentation

## 2014-01-16 LAB — POCT URINE PREGNANCY: Preg Test, Ur: NEGATIVE

## 2014-01-16 NOTE — Progress Notes (Signed)
Guadlupe SpanishMonica Renshaw is a 23 y.o. year old Caucasian female Gravida 2 Para 2  who presents for placement of a Mirena IUD. She is  postpartum and her pregnancy test today is negative.  The risks and benefits of the method and placement have been thouroughly reviewed with the patient and all questions were answered.  Specifically the patient is aware of failure rate of 10/998, expulsion of the IUD and of possible perforation.  The patient is aware of irregular bleeding due to the method and understands the incidence of irregular bleeding diminishes with time.  Time out was performed.  A Graves speculum was placed.  The cervix was prepped using Betadine. The uterus was found to be oriented to the left and it sounded to 7-8 cm.  The cervix was grasped with a tenaculum and the IUD was inserted to 7 cm.  It was pulled back 1 cm and the IUD was disengaged.  The strings were trimmed to 3 cm.  Sonogram was performed and the proper placement of the IUD was verified.  The patient was instructed on signs and symptoms of infection and to check for the strings after each menses or each month.  The patient is to refrain from intercourse for 3 days.  The patient is scheduled for a return appointment after her first menses or 4 weeks, as well as Friday for a labial cyst removal with Dr. Rob BuntingEure  Meshulem Onorato Cresenzo-Dishmon 01/16/2014 11:17 AM   Addendum:  Pt called office a few hours after appt stating that she was bleeding enough to where a pad was covered. No pain, minimal cramping. The only bleeding from insertion was from the tennaculum, so there is concern that the blood has not slowed.  Advised to come back to office.  Pt called back about an hour later and said the bleeding had slowed and she would not be coming in.  Has an appt Friday for cyst removal. Jacklyn ShellFrances Cresenzo-Dishmon

## 2014-01-18 ENCOUNTER — Ambulatory Visit (INDEPENDENT_AMBULATORY_CARE_PROVIDER_SITE_OTHER): Payer: BC Managed Care – PPO | Admitting: Obstetrics & Gynecology

## 2014-01-18 ENCOUNTER — Other Ambulatory Visit: Payer: Self-pay | Admitting: Obstetrics & Gynecology

## 2014-01-18 ENCOUNTER — Encounter: Payer: Self-pay | Admitting: Obstetrics & Gynecology

## 2014-01-18 VITALS — BP 100/58 | Ht 59.0 in | Wt 131.5 lb

## 2014-01-18 DIAGNOSIS — N764 Abscess of vulva: Secondary | ICD-10-CM | POA: Insufficient documentation

## 2014-01-18 NOTE — Progress Notes (Signed)
Patient ID: [name], female   DOB: 09-19-1991, 23 y.o.   MRN: [id number] Chronically, episodically infected left vulvar process  Excised today in total 2 sutures placed Follow up prn Local care

## 2014-01-21 ENCOUNTER — Encounter: Payer: Self-pay | Admitting: Obstetrics and Gynecology

## 2014-01-21 ENCOUNTER — Ambulatory Visit (INDEPENDENT_AMBULATORY_CARE_PROVIDER_SITE_OTHER): Payer: BC Managed Care – PPO | Admitting: Obstetrics and Gynecology

## 2014-01-21 ENCOUNTER — Telehealth: Payer: Self-pay | Admitting: Obstetrics and Gynecology

## 2014-01-21 VITALS — BP 94/60 | Ht 59.0 in | Wt 128.0 lb

## 2014-01-21 DIAGNOSIS — B373 Candidiasis of vulva and vagina: Secondary | ICD-10-CM | POA: Insufficient documentation

## 2014-01-21 DIAGNOSIS — B3731 Acute candidiasis of vulva and vagina: Secondary | ICD-10-CM

## 2014-01-21 DIAGNOSIS — N898 Other specified noninflammatory disorders of vagina: Secondary | ICD-10-CM

## 2014-01-21 LAB — POCT WET PREP (WET MOUNT)

## 2014-01-21 MED ORDER — FLUCONAZOLE 150 MG PO TABS: 150.0000 mg | ORAL_TABLET | Freq: Once | ORAL | Status: DC

## 2014-01-21 MED ORDER — NYSTATIN 100000 UNIT/GM EX CREA: 1.0000 "application " | TOPICAL_CREAM | Freq: Two times a day (BID) | CUTANEOUS | Status: DC

## 2014-01-21 NOTE — Patient Instructions (Signed)
Monilial Vaginitis  Vaginitis in a soreness, swelling and redness (inflammation) of the vagina and vulva. Monilial vaginitis is not a sexually transmitted infection.  CAUSES   Yeast vaginitis is caused by yeast (candida) that is normally found in your vagina. With a yeast infection, the candida has overgrown in number to a point that upsets the chemical balance.  SYMPTOMS   · White, thick vaginal discharge.  · Swelling, itching, redness and irritation of the vagina and possibly the lips of the vagina (vulva).  · Burning or painful urination.  · Painful intercourse.  DIAGNOSIS   Things that may contribute to monilial vaginitis are:  · Postmenopausal and virginal states.  · Pregnancy.  · Infections.  · Being tired, sick or stressed, especially if you had monilial vaginitis in the past.  · Diabetes. Good control will help lower the chance.  · Birth control pills.  · Tight fitting garments.  · Using bubble bath, feminine sprays, douches or deodorant tampons.  · Taking certain medications that kill germs (antibiotics).  · Sporadic recurrence can occur if you become ill.  TREATMENT   Your caregiver will give you medication.  · There are several kinds of anti monilial vaginal creams and suppositories specific for monilial vaginitis. For recurrent yeast infections, use a suppository or cream in the vagina 2 times a week, or as directed.  · Anti-monilial or steroid cream for the itching or irritation of the vulva may also be used. Get your caregiver's permission.  · Painting the vagina with methylene blue solution may help if the monilial cream does not work.  · Eating yogurt may help prevent monilial vaginitis.  HOME CARE INSTRUCTIONS   · Finish all medication as prescribed.  · Do not have sex until treatment is completed or after your caregiver tells you it is okay.  · Take warm sitz baths.  · Do not douche.  · Do not use tampons, especially scented ones.  · Wear cotton underwear.  · Avoid tight pants and panty  hose.  · Tell your sexual partner that you have a yeast infection. They should go to their caregiver if they have symptoms such as mild rash or itching.  · Your sexual partner should be treated as well if your infection is difficult to eliminate.  · Practice safer sex. Use condoms.  · Some vaginal medications cause latex condoms to fail. Vaginal medications that harm condoms are:  · Cleocin cream.  · Butoconazole (Femstat®).  · Terconazole (Terazol®) vaginal suppository.  · Miconazole (Monistat®) (may be purchased over the counter).  SEEK MEDICAL CARE IF:   · You have a temperature by mouth above 102° F (38.9° C).  · The infection is getting worse after 2 days of treatment.  · The infection is not getting better after 3 days of treatment.  · You develop blisters in or around your vagina.  · You develop vaginal bleeding, and it is not your menstrual period.  · You have pain when you urinate.  · You develop intestinal problems.  · You have pain with sexual intercourse.  Document Released: 06/30/2005 Document Revised: 12/13/2011 Document Reviewed: 03/14/2009  ExitCare® Patient Information ©2014 [employer].

## 2014-01-21 NOTE — Addendum Note (Signed)
Addended by: Gaylyn RongEVANS, Diallo Ponder A on: 01/21/2014 02:08 PM   Modules accepted: Orders

## 2014-01-21 NOTE — Progress Notes (Signed)
This chart was scribed by Leone PayorSonum Patel, Medical Scribe, for Dr. Christin BachJohn Taevin Mcferran on 01/21/14 at 1:42 PM. This chart was reviewed by Dr. Christin BachJohn Draiden Mirsky and is accurate.   [facility] Visit  Patient name: [name] MRN [id number]  Date of birth: [birth year]  CC & HPI:  [name] is a 23 y.o. female presenting today for vaginal itching for the past few days. She used Monistat yesterday without relief. Patient states she had an IUD placed on 01/17/14.   ROS:  +vaginal itching   Pertinent History Reviewed:  Medical & Surgical Hx:  Reviewed: Significant for  Past Medical History  Diagnosis Date  . HPV (human papilloma virus) infection   . Irritable bowel syndrome (IBS)   . Pregnant   . Abnormal Pap smear   . No pertinent past medical history     Past Surgical History  Procedure Laterality Date  . Wisdom tooth extraction    . Colonoscopy    . Cesarean section  08/03/2012    Procedure: CESAREAN SECTION;  Surgeon: Tilda BurrowJohn V Delton Stelle, MD;  Location: [facility];  Service: Obstetrics;  Laterality: N/A;    Medications: Reviewed & Updated - see associated section Social History: Reviewed -  reports that she has been smoking Cigarettes.  She has a 1.75 pack-year smoking history. She has never used smokeless tobacco.  Objective Findings:  Vitals: BP 94/60  Ht 4\' 11"  (1.499 m)  Wt 128 lb (58.06 kg)  BMI 25.84 kg/m2  Breastfeeding? Yes  Physical Examination: General appearance - alert, well appearing, and in no distress and oriented to person, place, and time Pelvic -  VULVA: normal appearing vulva with no masses, tenderness or lesions,  VAGINA: normal appearing vagina with normal color and discharge, no lesions,  CERVIX: normal appearing cervix without discharge or lesions, non purulent cervix   UTERUS: uterus is normal size, shape, consistency and nontender, IUD strings in place  ADNEXA: normal adnexa in size, nontender and no masses  Assessment & Plan:   A: 1. Monilial vulvovaginitis    P: 1. Mycolog and diflucan

## 2014-01-21 NOTE — Telephone Encounter (Signed)
Spoke with pt. Pt was wondering if she should take both tabs of Diflucan today or just 1 tab. I advised to take 1 tab today and if need be, repeat the dose in 3 days. Pt voiced understanding. JSY

## 2014-01-22 ENCOUNTER — Ambulatory Visit (INDEPENDENT_AMBULATORY_CARE_PROVIDER_SITE_OTHER): Payer: Self-pay | Admitting: Obstetrics & Gynecology

## 2014-01-22 ENCOUNTER — Encounter: Payer: Self-pay | Admitting: Obstetrics & Gynecology

## 2014-01-22 VITALS — BP 100/80 | Wt 129.0 lb

## 2014-01-22 DIAGNOSIS — Z9889 Other specified postprocedural states: Secondary | ICD-10-CM

## 2014-01-22 DIAGNOSIS — N764 Abscess of vulva: Secondary | ICD-10-CM

## 2014-01-22 NOTE — Progress Notes (Signed)
Patient ID: [name], female   DOB: [birth year], 23 y.o.   MRN: [id number] incsion clean dry intact No erythema No evidence of infection No swelling  Follow up 2 weeks  Past Medical History  Diagnosis Date  . HPV (human papilloma virus) infection   . Irritable bowel syndrome (IBS)   . Pregnant   . Abnormal Pap smear   . No pertinent past medical history     Past Surgical History  Procedure Laterality Date  . Wisdom tooth extraction    . Colonoscopy    . Cesarean section  08/03/2012    Procedure: CESAREAN SECTION;  Surgeon: Tilda BurrowJohn V Ferguson, MD;  Location: [facility];  Service: Obstetrics;  Laterality: N/A;    OB History   Grav Para Term Preterm Abortions TAB SAB Ect Mult Living   2 2 2  0 0 0 0 0 0 2      Allergies  Allergen Reactions  . Sulfa Drugs Cross Reactors Hives    History   Social History  . Marital Status: Married    Spouse Name: N/A    Number of Children: N/A  . Years of Education: N/A   Social History Main Topics  . Smoking status: Current Every Day Smoker -- 0.25 packs/day for 7 years    Types: Cigarettes  . Smokeless tobacco: Never Used  . Alcohol Use: No  . Drug Use: No  . Sexual Activity: Yes    Birth Control/ Protection: None, IUD   Other Topics Concern  . None   Social History Narrative  . None    Family History  Problem Relation Age of Onset  . Other Neg Hx   . Miscarriages / [place] Mother   . Heart disease Maternal Grandmother   . Cancer Maternal Grandfather     prostate  . Diabetes Paternal Grandmother

## 2014-01-26 ENCOUNTER — Emergency Department (HOSPITAL_COMMUNITY)
Admission: EM | Admit: 2014-01-26 | Discharge: 2014-01-26 | Disposition: A | Discharge status: Home / Self Care | Payer: BC Managed Care – PPO | Attending: Emergency Medicine | Admitting: Emergency Medicine

## 2014-01-26 ENCOUNTER — Encounter (HOSPITAL_COMMUNITY): Payer: Self-pay | Admitting: Emergency Medicine

## 2014-01-26 DIAGNOSIS — Z30431 Encounter for routine checking of intrauterine contraceptive device: Secondary | ICD-10-CM | POA: Diagnosis not present

## 2014-01-26 DIAGNOSIS — Z8719 Personal history of other diseases of the digestive system: Secondary | ICD-10-CM | POA: Diagnosis not present

## 2014-01-26 DIAGNOSIS — N898 Other specified noninflammatory disorders of vagina: Secondary | ICD-10-CM | POA: Insufficient documentation

## 2014-01-26 DIAGNOSIS — Z792 Long term (current) use of antibiotics: Secondary | ICD-10-CM | POA: Insufficient documentation

## 2014-01-26 DIAGNOSIS — N939 Abnormal uterine and vaginal bleeding, unspecified: Secondary | ICD-10-CM

## 2014-01-26 DIAGNOSIS — Z79899 Other long term (current) drug therapy: Secondary | ICD-10-CM | POA: Insufficient documentation

## 2014-01-26 DIAGNOSIS — F17200 Nicotine dependence, unspecified, uncomplicated: Secondary | ICD-10-CM | POA: Diagnosis not present

## 2014-01-26 DIAGNOSIS — Z8619 Personal history of other infectious and parasitic diseases: Secondary | ICD-10-CM | POA: Diagnosis not present

## 2014-01-26 DIAGNOSIS — Z3202 Encounter for pregnancy test, result negative: Secondary | ICD-10-CM | POA: Insufficient documentation

## 2014-01-26 LAB — I-STAT CHEM 8, ED
BUN: 8 mg/dL (ref 6–23)
CHLORIDE: 109 meq/L (ref 96–112)
Calcium, Ion: 1.28 mmol/L — ABNORMAL HIGH (ref 1.12–1.23)
Creatinine, Ser: 0.9 mg/dL (ref 0.50–1.10)
Glucose, Bld: 95 mg/dL (ref 70–99)
HCT: 41 % (ref 36.0–46.0)
Hemoglobin: 13.9 g/dL (ref 12.0–15.0)
Potassium: 3.6 mEq/L — ABNORMAL LOW (ref 3.7–5.3)
SODIUM: 146 meq/L (ref 137–147)
TCO2: 23 mmol/L (ref 0–100)

## 2014-01-26 LAB — POC URINE PREG, ED: PREG TEST UR: NEGATIVE

## 2014-01-26 LAB — WET PREP, GENITAL
Trich, Wet Prep: NONE SEEN
Yeast Wet Prep HPF POC: NONE SEEN

## 2014-01-26 NOTE — ED Notes (Signed)
Patient had baby 2 months ago and Mirena IUD placed on 01/16/14. Was told the provider could not get it all the way in.  Since that time she has had vaginal bleeding with the heaviest flow over the past 4 days.  Currently is using 1 regular pad/hour, but it isn't saturated.  She has had intercourse once since placement w/out pain, but stated that was the night she began bleeding.  States she has felt for the IUD and was able to feel "a plastic piece just to the right of my cervix".  She has experienced some mild cramping, but relates she doesn't know if it's from the IUD placement, the bleeding or breastfeeding.

## 2014-01-26 NOTE — ED Provider Notes (Signed)
CSN: [id number]     Arrival date & time 01/26/14  1830 History   First MD Initiated Contact with Patient 01/26/14 2012     Chief Complaint  Patient presents with  . Vaginal Bleeding     (Consider location/radiation/quality/duration/timing/severity/associated sxs/prior Treatment) The history is provided by the patient.   She complains of vaginal bleeding, for about 8 days, that is like a menses. It is longer than her usual menses. She delivered a child, about 2 months ago. She's not had a period yet. He had an IUD placed 10 days ago. She has intermittent lower abdomen cramping that is not painful. She denies fever, chills, nausea, vomiting, weakness, or dizziness. She saw her obstetrician oral days ago and was treated with Monistat for a yeast infection. She also recently had a procedure to remove a furuncle from the vulva. There are no other known modifying factors.  Past Medical History  Diagnosis Date  . HPV (human papilloma virus) infection   . Irritable bowel syndrome (IBS)   . Pregnant   . Abnormal Pap smear   . No pertinent past medical history    Past Surgical History  Procedure Laterality Date  . Wisdom tooth extraction    . Colonoscopy    . Cesarean section  08/03/2012    Procedure: CESAREAN SECTION;  Surgeon: Tilda BurrowJohn V Ferguson, MD;  Location: [facility];  Service: Obstetrics;  Laterality: N/A;   Family History  Problem Relation Age of Onset  . Other Neg Hx   . Miscarriages / [place] Mother   . Heart disease Maternal Grandmother   . Cancer Maternal Grandfather     prostate  . Diabetes Paternal Grandmother    History  Substance Use Topics  . Smoking status: Current Every Day Smoker -- 0.25 packs/day for 7 years    Types: Cigarettes  . Smokeless tobacco: Never Used  . Alcohol Use: No   OB History   Grav Para Term Preterm Abortions TAB SAB Ect Mult Living   2 2 2  0 0 0 0 0 0 2     Review of Systems    Allergies  Sulfa drugs cross reactors  Home Medications    Prior to Admission medications   Medication Sig Start Date End Date Taking? Authorizing Provider  acetaminophen (TYLENOL) 325 MG tablet Take 650 mg by mouth as needed.    Historical Provider, MD  clindamycin (CLEOCIN) 300 MG capsule Take 1 capsule (300 mg total) by mouth 3 (three) times daily. 01/03/14   Jacklyn ShellFrances Cresenzo-Dishmon, CNM  Docosahexaenoic Acid (PRENATAL DHA) 200 MG CAPS Take 1 capsule (200 mg total) by mouth daily. 07/23/13   Adline PotterJennifer A Griffin, NP  fluconazole (DIFLUCAN) 150 MG tablet Take 1 tablet (150 mg total) by mouth once. 01/21/14   Tilda BurrowJohn V Ferguson, MD  ibuprofen (ADVIL,MOTRIN) 600 MG tablet Take 1 tablet (600 mg total) by mouth every 6 (six) hours. 12/06/13   Wenda LowJames Joyner, MD  lidocaine (XYLOCAINE JELLY) 2 % jelly Apply 1 application topically as needed. 12/10/13   Lazaro ArmsLuther H Eure, MD  nystatin cream (MYCOSTATIN) Apply 1 application topically 2 (two) times daily. 01/21/14   Tilda BurrowJohn V Ferguson, MD  oxyCODONE-acetaminophen (PERCOCET/ROXICET) 5-325 MG per tablet Take 1-2 tablets by mouth every 4 (four) hours as needed for severe pain (moderate - severe pain). 12/10/13   Lazaro ArmsLuther H Eure, MD   BP 128/80  Pulse 85  Temp(Src) 98.3 F (36.8 C)  Resp 20  Ht 4\' 11"  (1.499 m)  Wt 128 lb (  58.06 kg)  BMI 25.84 kg/m2  SpO2 98% Physical Exam  Nursing note and vitals reviewed. Constitutional: She is oriented to person, place, and time. She appears well-developed and well-nourished.  HENT:  Head: Normocephalic and atraumatic.  Eyes: Conjunctivae and EOM are normal. Pupils are equal, round, and reactive to light.  Neck: Normal range of motion and phonation normal. Neck supple.  Cardiovascular: Normal rate, regular rhythm and intact distal pulses.   Pulmonary/Chest: Effort normal and breath sounds normal. She exhibits no tenderness.  Abdominal: Soft. She exhibits no distension. There is no tenderness. There is no guarding.  Genitourinary:  Normal external female genitalia. No swelling of the  vulva. On speculum examination there is a small amount of blood in the vagina. That is coming from the cervical os. The tip of her IUD is visible within the cervical os, and the strings appear longer than usual. Bimanual examination was deferred. Pressure on the uterus did not cause increased vaginal bleeding, under observation.  Musculoskeletal: Normal range of motion.  Neurological: She is alert and oriented to person, place, and time. She exhibits normal muscle tone.  Skin: Skin is warm and dry.  Psychiatric: She has a normal mood and affect. Her behavior is normal. Judgment and thought content normal.    ED Course  Procedures (including critical care time) Medications - No data to display  Patient Vitals for the past 24 hrs:  BP Temp Pulse Resp SpO2 Height Weight  01/26/14 1923 128/80 mmHg 98.3 F (36.8 C) 85 20 98 % 4\' 11"  (1.499 m) 128 lb (58.06 kg)    21:15- discussed the case with her gynecologist, Dr. Despina HiddenEure; he, states that no treatment is needed. Tonight, and that the patient should follow up for an appointment in the office in 2 days  10:21 PM Reevaluation with update and discussion. After initial assessment and treatment, an updated evaluation reveals no further complaints, findings discussed with patient and husband, all questions answered. Flint MelterElliott L Jazleen Robeck     Labs Review Labs Reviewed  WET PREP, GENITAL - Abnormal; Notable for the following:    Clue Cells Wet Prep HPF POC FEW (*)    WBC, Wet Prep HPF POC FEW (*)    All other components within normal limits  I-STAT CHEM 8, ED - Abnormal; Notable for the following:    Potassium 3.6 (*)    Calcium, Ion 1.28 (*)    All other components within normal limits  POC URINE PREG, ED      MDM   Final diagnoses:  Vaginal bleeding   Vaginal bleeding with an apparently partially expelled IUD. Patient is hemodynamically stable and has reassuring laboratory evaluation.  Nursing Notes Reviewed/ Care Coordinated Applicable  Imaging Reviewed Interpretation of Laboratory Data incorporated into ED treatment  The patient appears reasonably screened and/or stabilized for discharge and I doubt any other medical condition or other [facility] requiring further screening, evaluation, or treatment in the ED at this time prior to discharge.  Plan: Home Medications- usual; Home Treatments- rest, pelvic rest; return here if the recommended treatment, does not improve the symptoms; Recommended follow up- GYN in 2 days    Flint MelterElliott L Ravynn Hogate, MD 01/26/14 2222

## 2014-01-26 NOTE — ED Notes (Signed)
Patient with no complaints at this time. Respirations even and unlabored. Skin warm/dry. Discharge instructions reviewed with patient at this time. Patient given opportunity to voice concerns/ask questions. Patient discharged at this time and left Emergency Department with steady gait.   

## 2014-01-26 NOTE — ED Notes (Signed)
Pt c/o vaginal bleeding. Pt states she had an IUD placed on the 15th and pt started bleeding on the 21st. Pt staes she is still bleeding and feels like her IUD is coming out. [facility]

## 2014-01-26 NOTE — Discharge Instructions (Signed)
°  Followup at your gynecologist's office on Monday    Abnormal Uterine Bleeding Abnormal uterine bleeding means bleeding from the vagina that is not your normal menstrual period. This can be:  Bleeding or spotting between periods.  Bleeding after sex (sexual intercourse).  Bleeding that is heavier or more than normal.  Periods that last longer than usual.  Bleeding after menopause. There are many problems that may cause this. Treatment will depend on the cause of the bleeding. Any kind of bleeding that is not normal should be reviewed by your doctor.  HOME CARE Watch your condition for any changes. These actions may lessen any discomfort you are having:  Do not use tampons or douches as told by your doctor.  Change your pads often. You should get regular pelvic exams and Pap tests. Keep all appointments for tests as told by your doctor. GET HELP IF:  You are bleeding for more than 1 week.  You feel dizzy at times. GET HELP RIGHT AWAY IF:   You pass out.  You have to change pads every 15 to 30 minutes.  You have belly pain.  You have a fever.  You become sweaty or weak.  You are passing large blood clots from the vagina.  You feel sick to your stomach (nauseous) and throw up (vomit). MAKE SURE YOU:  Understand these instructions.  Will watch your condition.  Will get help right away if you are not doing well or get worse. Document Released: 07/18/2009 Document Revised: 07/11/2013 Document Reviewed: 04/19/2013 [facility] Patient Information 2014 [employer], [place].

## 2014-01-28 ENCOUNTER — Telehealth: Payer: Self-pay | Admitting: Women's Health

## 2014-01-28 ENCOUNTER — Encounter: Payer: Self-pay | Admitting: Women's Health

## 2014-01-28 ENCOUNTER — Ambulatory Visit (INDEPENDENT_AMBULATORY_CARE_PROVIDER_SITE_OTHER): Payer: BC Managed Care – PPO | Admitting: Women's Health

## 2014-01-28 VITALS — BP 90/60 | Ht 59.0 in | Wt 128.5 lb

## 2014-01-28 DIAGNOSIS — Z308 Encounter for other contraceptive management: Secondary | ICD-10-CM

## 2014-01-28 DIAGNOSIS — Z30432 Encounter for removal of intrauterine contraceptive device: Secondary | ICD-10-CM

## 2014-01-28 DIAGNOSIS — Z3202 Encounter for pregnancy test, result negative: Secondary | ICD-10-CM

## 2014-01-28 LAB — POCT URINE PREGNANCY: Preg Test, Ur: NEGATIVE

## 2014-01-28 MED ORDER — DIAPHRAGM ARC-SPRING 70 MM VA DPRH: 1.0000 [IU] | VAGINAL_INSERT | VAGINAL | Status: DC

## 2014-01-28 NOTE — Patient Instructions (Signed)
Diaphragm Information and Use A diaphragmis a soft, latex or silicone dome-shaped barrier that is placed in the vagina with spermicidal jelly before sexual intercourse. It covers the cervix, kills sperm, and blocks the passage of sperm into the cervix. This method does not protect against sexually transmitted diseases (STDs). A diaphragm must be fitted by a heath care provider during a pelvic exam. A heath care provider will measure your vagina prior to prescribing a diaphragm. You will also learn about use, care, and problems of a diaphragm during the exam. If you have significant weight changes (gain or lose 20% of your body weight) or become pregnant, it is important to have the diaphragm rechecked and possibly refitted. The diaphragm should be replaced every 2 years, or sooner if damaged.  The diaphragm can be inserted up to 2 hours before sex. If it is inserted more than 2 hours before intercourse, then the spermicide must be reapplied.  ADVANTAGES  You can use it while breastfeeding.  It is not felt by your sex partner.  It does not interfere with your female hormones.  It works immediately and is not permanent. DISADVANTAGES  It is sometimes difficult to insert.  It may shift out of place during sexual intercourse.  You must have it fitted and refitted by your health care provider.  It may increase the risk of urinary tract infections.  There is an increased risk of toxic shock syndrome if it is left in place for more than 24 hours. HOW TO INSERT A DIAPHRAGM 1. Check the diaphragm for holes by holding it up to the light, stretching the latex, or by filling it with water. 2. Place the spermicide cream or jelly inside the dome and around the rim of the diaphragm. 3. Squeeze the rim of the diaphragm and insert the diaphragm into the vagina. 4. The opening of the dome should face the cervix while inserting the diaphragm. 5. The front part (or top) of the rim should be behind the  pubic bone and pushed over the top of the cervix. 6. Be sure the cervix is completely covered by reaching into your vagina and feeling the cervix behind the latex dome of the diaphragm. If you are uncomfortable, it is not inserted properly. Try inserting it again. 7. Leave the diaphragm in for 6 to 8 hours after intercourse. If intercourse occurs again within these 6 hours, another application of spermicide is needed. 8. The diaphragm should not be left in place for longer than 24 hours. HOME CARE INSTRUCTIONS   Wash the diaphragm with mild soap and warm water. Rinse it thoroughly and dry it completely after every use.  Only use water-based lubricants with the diaphragm. Oil-based lubricants can damage the diaphragm.  Do not use talc on the diaphragm.  Do not use the diaphragm if:  You had a baby in the last 2 months.  You have a vaginal infection.  You are having a menstrual period.  You had recent surgery on your cervix or vagina.  You have vaginal bleeding of unknown cause.  Your sex partner is allergic to latex or spermicides. SEEK MEDICAL CARE IF:   You have pain during sexual intercourse when using the diaphragm.  The diaphragm slips out of place during sexual intercourse.  You have blood in your urine.  You have burning or pain when you urinate.  You find a hole in the diaphragm.  You develop abnormal vaginal discharge.  You have vaginal itching or irritation.  You cannot  remove the diaphragm.  You think you may be pregnant.  You need to be refitted for a diaphragm. Document Released: 12/11/2002 Document Revised: 05/23/2013 Document Reviewed: 03/12/2013 [facility] Patient Information 2014 [place]ExitCare, [place].

## 2014-01-28 NOTE — Telephone Encounter (Signed)
Notified pt trying to find where she can get diaphragm, will call her back when I have more info. For now, if she has sex, should use condom.  Cheral MarkerKimberly R. Salle Brandle, CNM, [facility]NP-BC 01/28/2014 4:05 PM

## 2014-01-28 NOTE — Progress Notes (Signed)
Patient ID: [name], female   DOB: 1990-10-24, 23 y.o.   MRN: [id number]   [facility]  Patient name: [name] MRN [id number]  Date of birth: 1990-10-24  CC & HPI:  [name] is a 23 y.o. Caucasian female 2 months s/p SVD, presenting today for report of bleeding since Mirena IUD placed 4/15, became heavier towards end of last week, went to ED this weekend, provider told her IUD was visible in cervix, so she wants it out and to be fitted for diaphragm- she does not want any hormonal contraception. States husband is getting vasectomy in June and she just needs something until then.   Pertinent History Reviewed:  Medical & Surgical Hx:   Past Medical History  Diagnosis Date  . HPV (human papilloma virus) infection   . Irritable bowel syndrome (IBS)   . Pregnant   . Abnormal Pap smear   . No pertinent past medical history    Past Surgical History  Procedure Laterality Date  . Wisdom tooth extraction    . Colonoscopy    . Cesarean section  08/03/2012    Procedure: CESAREAN SECTION;  Surgeon: Tilda BurrowJohn V Ferguson, MD;  Location: [facility];  Service: Obstetrics;  Laterality: N/A;   Medications: Reviewed & Updated - see associated section Social History: Reviewed -  reports that she has been smoking Cigarettes.  She has a 1.75 pack-year smoking history. She has never used smokeless tobacco.  Objective Findings:  Vitals: BP 90/60  Ht 4\' 11"  (1.499 m)  Wt 128 lb 8 oz (58.287 kg)  BMI 25.94 kg/m2  LMP 01/22/2014  Breastfeeding? Yes  Physical Examination: General appearance - alert, well appearing, and in no distress Pelvic: menstrual bleeding at introitus and in vault, Mirena IUD strings visible and appropriate length, no part of IUD visible in os.   Notified pt that Mirena is in place- not visible, still effective, that it can cause irregular bleeding for first 3-6 months, and w/ heavy bleeding can potentially move down further in uterus towards cervix, but is still  effective unless it falls out. Pt states she just wants it out, has heard too many stories about Mirena that scare her, and since hers is moving up and down she wants it out now.   The cervix was visualized, and the strings were visible. They were grasped and the Mirena was easily removed intact without complications.   Diaphragm fitted w/ Ortho Diaphragm All-Flex: 70mm placed, fit well, pt stood, coughed x 2 w/o movement of diaphragm- pt unable to feel diaphragm inside. Instructed on proper use.   Results for orders placed in visit on 01/28/14 (from the past 24 hour(s))  POCT URINE PREGNANCY   Collection Time    01/28/14 10:23 AM      Result Value Ref Range   Preg Test, Ur Negative     Assessment & Plan:  A:   Mirena removal  Diaphragm fitting P:  Rx Ortho All-Flex diaphragm 70mm, to apply spermicide to bowl side of diaphragm and insert    prior to sex, leave in x 8hrs after sex, then remove and cleanse   Condoms if 2nd method wanted  F/U [id number] for physical   Marge DuncansKimberly Randall Kenora Spayd CNM, [facility]WHNP-BC 01/28/2014 11:43 AM

## 2014-01-31 ENCOUNTER — Telehealth: Payer: Self-pay

## 2014-01-31 ENCOUNTER — Encounter: Payer: Self-pay | Admitting: Advanced Practice Midwife

## 2014-01-31 ENCOUNTER — Ambulatory Visit (INDEPENDENT_AMBULATORY_CARE_PROVIDER_SITE_OTHER): Payer: BC Managed Care – PPO | Admitting: Advanced Practice Midwife

## 2014-01-31 VITALS — BP 102/68 | Ht 59.0 in | Wt 126.0 lb

## 2014-01-31 DIAGNOSIS — Z309 Encounter for contraceptive management, unspecified: Secondary | ICD-10-CM

## 2014-01-31 DIAGNOSIS — Z3049 Encounter for surveillance of other contraceptives: Secondary | ICD-10-CM

## 2014-01-31 DIAGNOSIS — Z3202 Encounter for pregnancy test, result negative: Secondary | ICD-10-CM

## 2014-01-31 LAB — POCT URINE PREGNANCY: Preg Test, Ur: NEGATIVE

## 2014-01-31 MED ORDER — MEDROXYPROGESTERONE ACETATE 150 MG/ML IM SUSP: 150.0000 mg | INTRAMUSCULAR | Status: DC

## 2014-01-31 MED ORDER — MEDROXYPROGESTERONE ACETATE 150 MG/ML IM SUSP
150.0000 mg | Freq: Once | INTRAMUSCULAR | Status: AC
  Administered 2014-01-31: 150 mg via INTRAMUSCULAR

## 2014-01-31 NOTE — Telephone Encounter (Signed)
Depo went to [facility].  Please let pt know that she will need to pick it up and bring it with her; make an appt for depo unless she wants to call around some pharmacies to see if they can order the diaphragm (if so, let us know which pharm. To send rx to )

## 2014-01-31 NOTE — Telephone Encounter (Signed)
Pt fitted for Diaphragm on 01/28/2014 by Joellyn HaffKim Booker, CNM, pt states unable to get RX filled at the pharmacy "no carries anymore." Pt states would like to start Depo Provera.

## 2014-01-31 NOTE — Progress Notes (Signed)
Patient ID: [name], female   DOB: 11-13-1990, 23 y.o.   MRN: [id number] Depo Provera 150 mg IM given left deltoid with no complications, resulted negative pregnancy test. Pt to return in 12 weeks for next injection.

## 2014-01-31 NOTE — Telephone Encounter (Signed)
Pt informed Depo provera e-scribed to [facility], has an appt today at 3:30 for Depo injection.

## 2014-02-05 ENCOUNTER — Ambulatory Visit: Payer: BC Managed Care – PPO | Admitting: Obstetrics & Gynecology

## 2014-02-13 ENCOUNTER — Ambulatory Visit: Payer: BC Managed Care – PPO | Admitting: Obstetrics and Gynecology

## 2014-04-18 ENCOUNTER — Ambulatory Visit (INDEPENDENT_AMBULATORY_CARE_PROVIDER_SITE_OTHER): Payer: BC Managed Care – PPO | Admitting: Adult Health

## 2014-04-18 ENCOUNTER — Encounter: Payer: Self-pay | Admitting: Adult Health

## 2014-04-18 DIAGNOSIS — Z3202 Encounter for pregnancy test, result negative: Secondary | ICD-10-CM

## 2014-04-18 DIAGNOSIS — Z3049 Encounter for surveillance of other contraceptives: Secondary | ICD-10-CM

## 2014-04-18 DIAGNOSIS — Z3042 Encounter for surveillance of injectable contraceptive: Secondary | ICD-10-CM

## 2014-04-18 LAB — POCT URINE PREGNANCY: PREG TEST UR: NEGATIVE

## 2014-04-18 MED ORDER — MEDROXYPROGESTERONE ACETATE 150 MG/ML IM SUSP
150.0000 mg | Freq: Once | INTRAMUSCULAR | Status: AC
  Administered 2014-04-18: 150 mg via INTRAMUSCULAR

## 2014-07-11 ENCOUNTER — Encounter: Payer: Self-pay | Admitting: Adult Health

## 2014-07-11 ENCOUNTER — Ambulatory Visit (INDEPENDENT_AMBULATORY_CARE_PROVIDER_SITE_OTHER): Payer: BC Managed Care – PPO | Admitting: Adult Health

## 2014-07-11 DIAGNOSIS — Z3042 Encounter for surveillance of injectable contraceptive: Secondary | ICD-10-CM

## 2014-07-11 DIAGNOSIS — Z3202 Encounter for pregnancy test, result negative: Secondary | ICD-10-CM

## 2014-07-11 LAB — POCT URINE PREGNANCY: PREG TEST UR: NEGATIVE

## 2014-07-11 MED ORDER — MEDROXYPROGESTERONE ACETATE 150 MG/ML IM SUSP
150.0000 mg | Freq: Once | INTRAMUSCULAR | Status: AC
  Administered 2014-07-11: 150 mg via INTRAMUSCULAR

## 2014-08-05 ENCOUNTER — Encounter: Payer: Self-pay | Admitting: Adult Health

## 2014-08-22 ENCOUNTER — Ambulatory Visit: Payer: BC Managed Care – PPO | Admitting: Obstetrics & Gynecology

## 2014-08-27 ENCOUNTER — Encounter: Payer: Self-pay | Admitting: Obstetrics & Gynecology

## 2014-08-27 ENCOUNTER — Ambulatory Visit (INDEPENDENT_AMBULATORY_CARE_PROVIDER_SITE_OTHER): Payer: BC Managed Care – PPO | Admitting: Obstetrics & Gynecology

## 2014-08-27 VITALS — BP 126/80 | Wt 120.0 lb

## 2014-08-27 DIAGNOSIS — N764 Abscess of vulva: Secondary | ICD-10-CM

## 2014-08-27 MED ORDER — DOXYCYCLINE HYCLATE 50 MG PO CAPS: 100.0000 mg | ORAL_CAPSULE | Freq: Two times a day (BID) | ORAL | Status: DC

## 2014-09-10 ENCOUNTER — Encounter (HOSPITAL_COMMUNITY)
Admission: RE | Admit: 2014-09-10 | Discharge: 2014-09-10 | Disposition: A | Discharge status: Home / Self Care | Payer: BC Managed Care – PPO | Source: Ambulatory Visit | Attending: Obstetrics & Gynecology | Admitting: Obstetrics & Gynecology

## 2014-09-10 ENCOUNTER — Encounter: Payer: Self-pay | Admitting: Obstetrics & Gynecology

## 2014-09-10 ENCOUNTER — Encounter (HOSPITAL_COMMUNITY): Payer: Self-pay

## 2014-09-10 ENCOUNTER — Ambulatory Visit (INDEPENDENT_AMBULATORY_CARE_PROVIDER_SITE_OTHER): Payer: BC Managed Care – PPO | Admitting: Obstetrics & Gynecology

## 2014-09-10 VITALS — BP 108/70 | Wt 122.0 lb

## 2014-09-10 DIAGNOSIS — Z87891 Personal history of nicotine dependence: Secondary | ICD-10-CM | POA: Diagnosis not present

## 2014-09-10 DIAGNOSIS — N764 Abscess of vulva: Secondary | ICD-10-CM | POA: Diagnosis not present

## 2014-09-10 DIAGNOSIS — B373 Candidiasis of vulva and vagina: Secondary | ICD-10-CM | POA: Diagnosis not present

## 2014-09-10 LAB — CBC
HEMATOCRIT: 41.3 % (ref 36.0–46.0)
HEMOGLOBIN: 13.8 g/dL (ref 12.0–15.0)
MCH: 28.9 pg (ref 26.0–34.0)
MCHC: 33.4 g/dL (ref 30.0–36.0)
MCV: 86.6 fL (ref 78.0–100.0)
Platelets: 277 10*3/uL (ref 150–400)
RBC: 4.77 MIL/uL (ref 3.87–5.11)
RDW: 13.2 % (ref 11.5–15.5)
WBC: 4 10*3/uL (ref 4.0–10.5)

## 2014-09-10 LAB — URINALYSIS, ROUTINE W REFLEX MICROSCOPIC
Bilirubin Urine: NEGATIVE
Glucose, UA: NEGATIVE mg/dL
HGB URINE DIPSTICK: NEGATIVE
Ketones, ur: NEGATIVE mg/dL
LEUKOCYTES UA: NEGATIVE
Nitrite: NEGATIVE
PROTEIN: NEGATIVE mg/dL
Specific Gravity, Urine: 1.025 (ref 1.005–1.030)
Urobilinogen, UA: 0.2 mg/dL (ref 0.0–1.0)
pH: 6.5 (ref 5.0–8.0)

## 2014-09-10 LAB — COMPREHENSIVE METABOLIC PANEL
ALT: 20 U/L (ref 0–35)
AST: 17 U/L (ref 0–37)
Albumin: 3.9 g/dL (ref 3.5–5.2)
Alkaline Phosphatase: 89 U/L (ref 39–117)
Anion gap: 13 (ref 5–15)
BUN: 9 mg/dL (ref 6–23)
CALCIUM: 9 mg/dL (ref 8.4–10.5)
CO2: 21 mEq/L (ref 19–32)
CREATININE: 0.64 mg/dL (ref 0.50–1.10)
Chloride: 106 mEq/L (ref 96–112)
GLUCOSE: 91 mg/dL (ref 70–99)
Potassium: 4.1 mEq/L (ref 3.7–5.3)
Sodium: 140 mEq/L (ref 137–147)
Total Bilirubin: 0.3 mg/dL (ref 0.3–1.2)
Total Protein: 6.8 g/dL (ref 6.0–8.3)

## 2014-09-10 LAB — HCG, QUANTITATIVE, PREGNANCY: hCG, Beta Chain, Quant, S: <1 m[IU]/mL (ref <5)

## 2014-09-10 MED ORDER — HYDROCODONE-ACETAMINOPHEN 5-325 MG PO TABS: 1.0000 | ORAL_TABLET | Freq: Four times a day (QID) | ORAL | Status: DC | PRN

## 2014-09-10 MED ORDER — ONDANSETRON HCL 8 MG PO TABS: 8.0000 mg | ORAL_TABLET | Freq: Three times a day (TID) | ORAL | Status: DC | PRN

## 2014-09-10 MED ORDER — KETOROLAC TROMETHAMINE 10 MG PO TABS: 10.0000 mg | ORAL_TABLET | Freq: Three times a day (TID) | ORAL | Status: DC | PRN

## 2014-09-10 NOTE — Progress Notes (Signed)
Patient ID: [name], female   DOB: March 19, 1991, 23 y.o.   MRN: [id number] Preoperative History and Physical  [name] is a 23 y.o. [id number] with No LMP recorded. admitted for a wide surgical excsion of a recurrent left vulvar cyst.  We have treated with antibiotics now over several months and incision and drainage all of which have eventually failed  PMH:    Past Medical History  Diagnosis Date  . HPV (human papilloma virus) infection   . Irritable bowel syndrome (IBS)   . Pregnant   . Abnormal Pap smear   . No pertinent past medical history     PSH:     Past Surgical History  Procedure Laterality Date  . Wisdom tooth extraction    . Colonoscopy    . Cesarean section  08/03/2012    Procedure: CESAREAN SECTION;  Surgeon: Tilda BurrowJohn V Ferguson, MD;  Location: [facility];  Service: Obstetrics;  Laterality: N/A;    POb/GynH:      OB History    Gravida Para Term Preterm AB TAB SAB Ectopic Multiple Living   2 2 2  0 0 0 0 0 0 2      SH:   History  Substance Use Topics  . Smoking status: Former Smoker -- 0.25 packs/day for 7 years    Types: Cigarettes  . Smokeless tobacco: Never Used  . Alcohol Use: Yes     Comment: once in a blue moon    FH:    Family History  Problem Relation Age of Onset  . Other Neg Hx   . Miscarriages / [place] Mother   . Heart disease Maternal Grandmother   . Cancer Maternal Grandfather     prostate  . Diabetes Paternal Grandmother      Allergies:  Allergies  Allergen Reactions  . Sulfa Drugs Cross Reactors Hives    Medications:      Current outpatient prescriptions: doxycycline (VIBRAMYCIN) 50 MG capsule, Take 2 capsules (100 mg total) by mouth 2 (two) times daily., Disp: 40 capsule, Rfl: 0;  medroxyPROGESTERone (DEPO-PROVERA) 150 MG/ML injection, Inject 1 mL (150 mg total) into the muscle every 3 (three) months., Disp: 1 mL, Rfl: 3;  acetaminophen (TYLENOL) 500 MG tablet, Take 2,000 mg by mouth every 6 (six) hours as needed., Disp: , Rfl:   HYDROcodone-acetaminophen (NORCO/VICODIN) 5-325 MG per tablet, Take 1 tablet by mouth every 6 (six) hours as needed., Disp: 30 tablet, Rfl: 0;  ketorolac (TORADOL) 10 MG tablet, Take 1 tablet (10 mg total) by mouth every 8 (eight) hours as needed., Disp: 15 tablet, Rfl: 0;  ondansetron (ZOFRAN) 8 MG tablet, Take 1 tablet (8 mg total) by mouth every 8 (eight) hours as needed for nausea., Disp: 12 tablet, Rfl: 0  Review of Systems:   Review of Systems  Constitutional: Negative for fever, chills, weight loss, malaise/fatigue and diaphoresis.  HENT: Negative for hearing loss, ear pain, nosebleeds, congestion, sore throat, neck pain, tinnitus and ear discharge.   Eyes: Negative for blurred vision, double vision, photophobia, pain, discharge and redness.  Respiratory: Negative for cough, hemoptysis, sputum production, shortness of breath, wheezing and stridor.   Cardiovascular: Negative for chest pain, palpitations, orthopnea, claudication, leg swelling and PND.  Gastrointestinal: Positive for abdominal pain. Negative for heartburn, nausea, vomiting, diarrhea, constipation, blood in stool and melena.  Genitourinary: Negative for dysuria, urgency, frequency, hematuria and flank pain.  Musculoskeletal: Negative for myalgias, back pain, joint pain and falls.  Skin: Negative for itching and rash.  Neurological:  Negative for dizziness, tingling, tremors, sensory change, speech change, focal weakness, seizures, loss of consciousness, weakness and headaches.  Endo/Heme/Allergies: Negative for environmental allergies and polydipsia. Does not bruise/bleed easily.  Psychiatric/Behavioral: Negative for depression, suicidal ideas, hallucinations, memory loss and substance abuse. The patient is not nervous/anxious and does not have insomnia.      PHYSICAL EXAM:  Blood pressure 108/70, weight 122 lb (55.339 kg), not currently breastfeeding.    Vitals reviewed. Constitutional: She is oriented to person,  place, and time. She appears well-developed and well-nourished.  HENT:  Head: Normocephalic and atraumatic.  Right Ear: External ear normal.  Left Ear: External ear normal.  Nose: Nose normal.  Mouth/Throat: Oropharynx is clear and moist.  Eyes: Conjunctivae and EOM are normal. Pupils are equal, round, and reactive to light. Right eye exhibits no discharge. Left eye exhibits no discharge. No scleral icterus.  Neck: Normal range of motion. Neck supple. No tracheal deviation present. No thyromegaly present.  Cardiovascular: Normal rate, regular rhythm, normal heart sounds and intact distal pulses.  Exam reveals no gallop and no friction rub.   No murmur heard. Respiratory: Effort normal and breath sounds normal. No respiratory distress. She has no wheezes. She has no rales. She exhibits no tenderness.  GI: Soft. Bowel sounds are normal. She exhibits no distension and no mass. There is tenderness. There is no rebound and no guarding.  Genitourinary:       Vulva is significant for a recurrent left vulvar abscess erythema Vagina is pink moist without discharge Cervix normal in appearance and pap is normal Uterus is normal size shape contour Adnexa is negative with normal sized ovaries by sonogram  Musculoskeletal: Normal range of motion. She exhibits no edema and no tenderness.  Neurological: She is alert and oriented to person, place, and time. She has normal reflexes. She displays normal reflexes. No cranial nerve deficit. She exhibits normal muscle tone. Coordination normal.  Skin: Skin is warm and dry. No rash noted. No erythema. No pallor.  Psychiatric: She has a normal mood and affect. Her behavior is normal. Judgment and thought content normal.    Labs: No results found for this or any previous visit (from the past 336 hour(s)).  EKG: Orders placed or performed in visit on 07/09/13  . Cardiac event monitor    Imaging Studies: No results found.    Assessment: Left vulvar  boil/abscess Patient Active Problem List   Diagnosis Date Noted  . Monilial vulvovaginitis 01/21/2014  . Boil of vulva 01/18/2014  . Smoker 05/01/2013    Plan: surical removal of boil/abscess with wide margins/ partial vulvectomy  Lamoine Magallon H 09/10/2014 10:21 AM

## 2014-09-10 NOTE — Patient Instructions (Signed)
[name]  09/10/2014   Your procedure is scheduled on:  09/11/2014  [facility].  Call this number if you have problems the morning of surgery: [phone]   Remember:   Do not eat food or drink liquids after midnight.   Take these medicines the morning of surgery with A SIP OF WATER: Hydrocodone, Zofran   Do not wear jewelry, make-up or nail polish.  Do not wear lotions, powders, or perfumes. You may wear deodorant.  Do not shave 48 hours prior to surgery. Men may shave face and neck.  Do not bring valuables to the hospital.  [facility] for any belongings or valuables.               Contacts, dentures or bridgework may not be worn into surgery.  Leave suitcase in the car. After surgery it may be brought to your room.  For patients admitted to the hospital, discharge time is determined by your treatment team.               Patients discharged the day of surgery will not be allowed to drive home.  Name and phone number of your driver:   Special Instructions: Shower using CHG 2 nights before surgery and the night before surgery.  If you shower the day of surgery use CHG.  Use special wash - you have one bottle of CHG for all showers.  You should use approximately 1/3 of the bottle for each shower.   Please read over the following fact sheets that you were given: Surgical Site Infection Prevention and Anesthesia Post-op Instructions   PATIENT INSTRUCTIONS POST-ANESTHESIA  IMMEDIATELY FOLLOWING SURGERY:  Do not drive or operate machinery for the first twenty four hours after surgery.  Do not make any important decisions for twenty four hours after surgery or while taking narcotic pain medications or sedatives.  If you develop intractable nausea and vomiting or a severe headache please notify your doctor immediately.  FOLLOW-UP:  Please make an appointment with your surgeon as instructed. You do not need to follow up with anesthesia unless  specifically instructed to do so.  WOUND CARE INSTRUCTIONS (if applicable):  Keep a dry clean dressing on the anesthesia/puncture wound site if there is drainage.  Once the wound has quit draining you may leave it open to air.  Generally you should leave the bandage intact for twenty four hours unless there is drainage.  If the epidural site drains for more than 36-48 hours please call the anesthesia department.  QUESTIONS?:  Please feel free to call your physician or the hospital operator if you have any questions, and they will be happy to assist you.      Vulvar Abscess  The vulva is made up of the large and small flaps of skin around the vagina opening. A vulvar abscess is an infected sac of yellowish white fluid (pus) in the skin flaps. Your doctor may make a small cut in the skin to drain the vulvar abscess.  HOME CARE  Only take medicine as told by your doctor.  Soak or take a warm water bath (sitz bath) 3 to 4 times a day for 15 to 20 minutes.  After you pee (urinate), always wipe from front to back.  Clean the vulvar abscess with soap and warm water. Do this after going to the bathroom.  Wear loose-fitting clothing.  Do not have sex until the vulvar abscess is gone or as  told by your doctor. GET HELP RIGHT AWAY IF:   You have a temperature by mouth above 102 F (38.9 C).  The vulva area becomes more painful, puffy (swollen), or red.  You have fluid coming from the vulva area that is red or tan.  You have pain when you pee or have a hard time peeing. MAKE SURE YOU:  Understand these instructions.  Will watch your condition.  Will get help if you are not doing well or get worse. Document Released: 12/17/2008 Document Revised: 12/13/2011 Document Reviewed: 12/17/2008 [facility] Patient Information 2015 [employer], [place]. This information is not intended to replace advice given to you by your health care provider. Make sure you discuss any questions you have with your health  care provider.

## 2014-09-11 ENCOUNTER — Ambulatory Visit (HOSPITAL_COMMUNITY): Payer: BC Managed Care – PPO | Admitting: Anesthesiology

## 2014-09-11 ENCOUNTER — Telehealth: Payer: Self-pay | Admitting: Obstetrics & Gynecology

## 2014-09-11 ENCOUNTER — Encounter (HOSPITAL_COMMUNITY): Payer: Self-pay | Admitting: *Deleted

## 2014-09-11 ENCOUNTER — Ambulatory Visit (HOSPITAL_COMMUNITY)
Admission: RE | Admit: 2014-09-11 | Discharge: 2014-09-11 | Disposition: A | Discharge status: Home / Self Care | Payer: BC Managed Care – PPO | Source: Ambulatory Visit | Attending: Obstetrics & Gynecology | Admitting: Obstetrics & Gynecology

## 2014-09-11 ENCOUNTER — Encounter (HOSPITAL_COMMUNITY)
Admission: RE | Disposition: A | Discharge status: Home / Self Care | Payer: Self-pay | Source: Ambulatory Visit | Attending: Obstetrics & Gynecology

## 2014-09-11 DIAGNOSIS — N764 Abscess of vulva: Secondary | ICD-10-CM | POA: Diagnosis not present

## 2014-09-11 DIAGNOSIS — B373 Candidiasis of vulva and vagina: Secondary | ICD-10-CM | POA: Insufficient documentation

## 2014-09-11 DIAGNOSIS — Z87891 Personal history of nicotine dependence: Secondary | ICD-10-CM | POA: Insufficient documentation

## 2014-09-11 HISTORY — PX: VULVAR LESION REMOVAL: SHX5391

## 2014-09-11 SURGERY — VULVAR LESION
Anesthesia: General | Laterality: Left

## 2014-09-11 MED ORDER — MIDAZOLAM HCL 2 MG/2ML IJ SOLN
1.0000 mg | INTRAMUSCULAR | Status: DC | PRN
  Administered 2014-09-11: 2 mg via INTRAVENOUS

## 2014-09-11 MED ORDER — LACTATED RINGERS IV SOLN
INTRAVENOUS | Status: DC
  Administered 2014-09-11: 1000 mL via INTRAVENOUS

## 2014-09-11 MED ORDER — ONDANSETRON HCL 4 MG/2ML IJ SOLN
INTRAMUSCULAR | Status: AC
  Filled 2014-09-11: qty 2

## 2014-09-11 MED ORDER — SILVER SULFADIAZINE 1 % EX CREA: TOPICAL_CREAM | CUTANEOUS | Status: DC

## 2014-09-11 MED ORDER — FENTANYL CITRATE 0.05 MG/ML IJ SOLN: 25.0000 ug | INTRAMUSCULAR | Status: DC | PRN

## 2014-09-11 MED ORDER — FENTANYL CITRATE 0.05 MG/ML IJ SOLN
25.0000 ug | INTRAMUSCULAR | Status: AC
  Administered 2014-09-11 (×2): 25 ug via INTRAVENOUS

## 2014-09-11 MED ORDER — FENTANYL CITRATE 0.05 MG/ML IJ SOLN
INTRAMUSCULAR | Status: AC
  Filled 2014-09-11: qty 5

## 2014-09-11 MED ORDER — BUPIVACAINE LIPOSOME 1.3 % IJ SUSP
INTRAMUSCULAR | Status: DC | PRN
  Administered 2014-09-11: 16 mL

## 2014-09-11 MED ORDER — BUPIVACAINE LIPOSOME 1.3 % IJ SUSP
INTRAMUSCULAR | Status: AC
  Filled 2014-09-11: qty 20

## 2014-09-11 MED ORDER — LIDOCAINE HCL (PF) 1 % IJ SOLN
INTRAMUSCULAR | Status: AC
  Filled 2014-09-11: qty 5

## 2014-09-11 MED ORDER — FENTANYL CITRATE 0.05 MG/ML IJ SOLN
INTRAMUSCULAR | Status: AC
  Filled 2014-09-11: qty 2

## 2014-09-11 MED ORDER — 0.9 % SODIUM CHLORIDE (POUR BTL) OPTIME
TOPICAL | Status: DC | PRN
  Administered 2014-09-11: 1000 mL

## 2014-09-11 MED ORDER — ONDANSETRON HCL 4 MG/2ML IJ SOLN
4.0000 mg | Freq: Once | INTRAMUSCULAR | Status: AC
  Administered 2014-09-11: 4 mg via INTRAVENOUS

## 2014-09-11 MED ORDER — FENTANYL CITRATE 0.05 MG/ML IJ SOLN
INTRAMUSCULAR | Status: DC | PRN
  Administered 2014-09-11 (×2): 25 ug via INTRAVENOUS
  Administered 2014-09-11: 50 ug via INTRAVENOUS
  Administered 2014-09-11 (×2): 25 ug via INTRAVENOUS

## 2014-09-11 MED ORDER — CEFAZOLIN SODIUM-DEXTROSE 2-3 GM-% IV SOLR
2.0000 g | INTRAVENOUS | Status: AC
  Administered 2014-09-11: 2 g via INTRAVENOUS
  Filled 2014-09-11: qty 50

## 2014-09-11 MED ORDER — ONDANSETRON HCL 4 MG/2ML IJ SOLN: 4.0000 mg | Freq: Once | INTRAMUSCULAR | Status: DC | PRN

## 2014-09-11 MED ORDER — LIDOCAINE HCL (CARDIAC) 20 MG/ML IV SOLN
INTRAVENOUS | Status: DC | PRN
  Administered 2014-09-11: 50 mg via INTRAVENOUS

## 2014-09-11 MED ORDER — MIDAZOLAM HCL 2 MG/2ML IJ SOLN
INTRAMUSCULAR | Status: AC
  Filled 2014-09-11: qty 2

## 2014-09-11 MED ORDER — BUPIVACAINE LIPOSOME 1.3 % IJ SUSP
20.0000 mL | Freq: Once | INTRAMUSCULAR | Status: DC
  Filled 2014-09-11: qty 20

## 2014-09-11 MED ORDER — PROPOFOL 10 MG/ML IV BOLUS
INTRAVENOUS | Status: DC | PRN
  Administered 2014-09-11: 150 mg via INTRAVENOUS

## 2014-09-11 MED ORDER — LACTATED RINGERS IV SOLN
INTRAVENOUS | Status: DC | PRN
  Administered 2014-09-11: 09:00:00 via INTRAVENOUS

## 2014-09-11 MED ORDER — KETOROLAC TROMETHAMINE 30 MG/ML IJ SOLN
30.0000 mg | Freq: Once | INTRAMUSCULAR | Status: AC
  Administered 2014-09-11: 30 mg via INTRAVENOUS
  Filled 2014-09-11: qty 1

## 2014-09-11 MED ORDER — PROPOFOL 10 MG/ML IV BOLUS
INTRAVENOUS | Status: AC
  Filled 2014-09-11: qty 20

## 2014-09-11 SURGICAL SUPPLY — 27 items
BAG HAMPER (MISCELLANEOUS) ×3 IMPLANT
CLOTH BEACON ORANGE TIMEOUT ST (SAFETY) ×3 IMPLANT
COVER LIGHT HANDLE STERIS (MISCELLANEOUS) ×6 IMPLANT
DRAPE PROXIMA HALF (DRAPES) ×2 IMPLANT
ELECT REM PT RETURN 9FT ADLT (ELECTROSURGICAL) ×3
ELECTRODE REM PT RTRN 9FT ADLT (ELECTROSURGICAL) ×1 IMPLANT
GLOVE BIOGEL M 6.5 STRL (GLOVE) ×2 IMPLANT
GLOVE BIOGEL PI IND STRL 7.0 (GLOVE) IMPLANT
GLOVE BIOGEL PI IND STRL 8 (GLOVE) ×1 IMPLANT
GLOVE BIOGEL PI INDICATOR 7.0 (GLOVE) ×4
GLOVE BIOGEL PI INDICATOR 8 (GLOVE) ×2
GLOVE ECLIPSE 8.0 STRL XLNG CF (GLOVE) ×3 IMPLANT
GOWN STRL REUS W/TWL LRG LVL3 (GOWN DISPOSABLE) ×4 IMPLANT
GOWN STRL REUS W/TWL XL LVL3 (GOWN DISPOSABLE) ×3 IMPLANT
KIT ROOM TURNOVER APOR (KITS) ×3 IMPLANT
MANIFOLD NEPTUNE II (INSTRUMENTS) ×3 IMPLANT
NDL HYPO 18GX1.5 BLUNT FILL (NEEDLE) IMPLANT
NDL HYPO 25X1 1.5 SAFETY (NEEDLE) ×1 IMPLANT
NEEDLE HYPO 18GX1.5 BLUNT FILL (NEEDLE) ×3 IMPLANT
NEEDLE HYPO 25X1 1.5 SAFETY (NEEDLE) ×3 IMPLANT
PACK PERI GYN (CUSTOM PROCEDURE TRAY) ×3 IMPLANT
PAD ARMBOARD 7.5X6 YLW CONV (MISCELLANEOUS) ×3 IMPLANT
SET BASIN LINEN APH (SET/KITS/TRAYS/PACK) ×3 IMPLANT
SUT MNCRL+ AB 3-0 CT1 36 (SUTURE) IMPLANT
SUT MON AB 3-0 SH 27 (SUTURE) ×2 IMPLANT
SUT MONOCRYL AB 3-0 CT1 36IN (SUTURE) ×2
SYR CONTROL 10ML LL (SYRINGE) ×2 IMPLANT

## 2014-09-11 NOTE — Discharge Instructions (Signed)
Vulvectomy, Care After °The vulva is the external female genitalia, outside and around the vagina and pubic bone. It consists of: °· The skin on, and in front of, the pubic bone. °· The clitoris. °· The labia majora (large lips) on the outside of the vagina. °· The labia minora (small lips) around the opening of the vagina. °· The opening and the skin in and around the vagina. °A vulvectomy is the removal of the tissue of the vulva, which sometimes includes removal of the lymph nodes and tissue in the groin areas. °These discharge instructions provide you with general information on caring for yourself after you leave the hospital. It is also important that you know the warning signs of complications, so that you can seek treatment. Please read the instructions outlined below and refer to this sheet in the next few weeks. Your caregiver may also give you specific information and medicines. If you have any questions or complications after discharge, please call your caregiver. °ACTIVITY °· Rest as much as possible the first two weeks after discharge. °· Arrange to have help from family or others with your daily activities when you go home. °· Avoid heavy lifting (more than 5 pounds), pushing, or pulling. °· If you feel tired, balance your activity with rest periods. °· Follow your caregiver's instruction about climbing stairs and driving a car. °· Increase activity gradually. °· Do not exercise until you have permission from your caregiver. °LEG AND FOOT CARE °If your doctor has removed lymph nodes from your groin area, there may be an increase in swelling of your legs and feet. You can help prevent swelling by doing the following: °· Elevate your legs while sitting or lying down. °· If your caregiver has ordered special stockings, wear them according to instructions. °· Avoid standing in one place for long periods of time. °· Call the physical therapy department if you have any questions about swelling or treatment  for swelling. °· Avoid salt in your diet. It can cause fluid retention and swelling. °· Do not cross your legs, especially when sitting. °NUTRITION °· You may resume your normal diet. °· Drink 6 to 8 glasses of fluids a day. °· Eat a healthy, balanced diet including portions of food from the meat (protein), milk, fruit, vegetable, and bread groups. °· Your caregiver may recommend you take a multivitamin with iron. °ELIMINATION °· You may notice that your stream of urine is at a different angle, and may tend to spray. Using a plastic funnel may help to decrease urine spray. °· If constipation occurs, drink more liquids, and add more fruits, vegetables, and bran to your diet. You may take a mild laxative, such as Milk of Magnesia, Metamucil, or a stool softener such as Colace, with permission from your caregiver. °HYGIENE °· You may shower and wash your hair. °· Check with your caregiver about tub baths. °· Do not add any bath oils or chemicals to your bath water, after you have permission to take baths. °· Clean yourself well after moving your bowels. °· After urinating, wipe from top to bottom. °· A sitz bath will help keep your perineal area clean, reduce swelling, and provide comfort. °HOME CARE INSTRUCTIONS  °· Take your temperature twice a day and record it, especially if you feel feverish or have chills. °· Follow your caregiver's instructions about medicines, activity, and follow-up appointments after surgery. °· Do not drink alcohol while taking pain medicine. °· Change your dressing as advised by your caregiver. °·   You may take over-the-counter medicine for pain, recommended by your caregiver. °· If your pain is not relieved with medicine, call your caregiver. °· Do not take aspirin because it can cause bleeding. °· Do not douche or use tampons (use a nonperfumed sanitary pad). °· Do not have sexual intercourse until your caregiver gives you permission. Hugging, kissing, and playful sexual activity is fine  with your caregiver's permission. °· Warm sitz baths, with your caregiver's permission, are helpful to control swelling and discomfort. °· Take showers instead of baths, until your caregiver gives you permission to take baths. °· You may take a mild medicine for constipation, recommended by your caregiver. Bran foods and drinking a lot of fluids will help with constipation. °· Make sure your family understands everything about your operation and recovery. °SEEK MEDICAL CARE IF:  °· You notice swelling and redness around the wound area. °· You notice a foul smell coming from the wound or on the surgical dressing. °· You notice the wound is separating. °· You have painful or bloody urination. °· You develop nausea and vomiting. °· You develop diarrhea. °· You develop a rash. °· You have a reaction or allergy from the medicine. °· You feel dizzy or light-headed. °· You need stronger pain medicine. °SEEK IMMEDIATE MEDICAL CARE IF:  °· You develop a temperature of 102° F (38.9° C) or higher. °· You pass out. °· You develop leg or chest pain. °· You develop abdominal pain. °· You develop shortness of breath. °· You develop bleeding from the wound area. °· You see pus in the wound area. °MAKE SURE YOU:  °· Understand these instructions. °· Will watch your condition. °· Will get help right away if you are not doing well or get worse. °Document Released: 05/04/2004 Document Revised: 02/04/2014 Document Reviewed: 08/22/2009 °ExitCare® Patient Information ©2015 [employer]. This information is not intended to replace advice given to you by your health care provider. Make sure you discuss any questions you have with your health care provider. ° °

## 2014-09-11 NOTE — H&P (Signed)
Preoperative History and Physical  [name] is a 23 y.o. [id number] with No LMP recorded (lmp unknown). admitted for a excisio0n of a chronic left vulvar abscess/infectious process with has persisted despite numerour courses of antibiotics and office I&D.    PMH:    Past Medical History  Diagnosis Date  . HPV (human papilloma virus) infection   . Irritable bowel syndrome (IBS)   . Pregnant   . Abnormal Pap smear   . No pertinent past medical history     PSH:     Past Surgical History  Procedure Laterality Date  . Wisdom tooth extraction    . Colonoscopy    . Cesarean section  08/03/2012    Procedure: CESAREAN SECTION;  Surgeon: Tilda Burrow, MD;  Location: [facility];  Service: Obstetrics;  Laterality: N/A;    POb/GynH:      OB History    Gravida Para Term Preterm AB TAB SAB Ectopic Multiple Living   2 2 2  0 0 0 0 0 0 2      SH:   History  Substance Use Topics  . Smoking status: Former Smoker -- 0.25 packs/day for 7 years    Types: Cigarettes    Quit date: 02/08/2014  . Smokeless tobacco: Never Used  . Alcohol Use: Yes     Comment: once in a blue moon    FH:    Family History  Problem Relation Age of Onset  . Other Neg Hx   . Miscarriages / [place] Mother   . Heart disease Maternal Grandmother   . Cancer Maternal Grandfather     prostate  . Diabetes Paternal Grandmother      Allergies:  Allergies  Allergen Reactions  . Sulfa Drugs Cross Reactors Hives    Medications:      Current facility-administered medications: bupivacaine liposome (EXPAREL) 1.3 % injection 266 mg, 20 mL, Infiltration, Once, Lazaro Arms, MD;  ceFAZolin (ANCEF) IVPB 2 g/50 mL premix, 2 g, Intravenous, On Call to OR, Lazaro Arms, MD;  fentaNYL (SUBLIMAZE) injection 25 mcg, 25 mcg, Intravenous, Q10 min, Laurene Footman, MD, 25 mcg at 09/11/14 1478 lactated ringers infusion, , Intravenous, Continuous, Laurene Footman, MD, Last Rate: 75 mL/hr at 09/11/14 0954, 1,000 mL at 09/11/14 0954;   midazolam (VERSED) injection 1-2 mg, 1-2 mg, Intravenous, Q5 Min x 3 PRN, Laurene Footman, MD, 2 mg at 09/11/14 2956 Facility-Administered Medications Ordered in Other Encounters: lactated ringers infusion, , , Continuous PRN, Amy A Adams, CRNA  Review of Systems:   Review of Systems  Constitutional: Negative for fever, chills, weight loss, malaise/fatigue and diaphoresis.  HENT: Negative for hearing loss, ear pain, nosebleeds, congestion, sore throat, neck pain, tinnitus and ear discharge.   Eyes: Negative for blurred vision, double vision, photophobia, pain, discharge and redness.  Respiratory: Negative for cough, hemoptysis, sputum production, shortness of breath, wheezing and stridor.   Cardiovascular: Negative for chest pain, palpitations, orthopnea, claudication, leg swelling and PND.  Gastrointestinal: Positive for abdominal pain. Negative for heartburn, nausea, vomiting, diarrhea, constipation, blood in stool and melena.  Genitourinary: Negative for dysuria, urgency, frequency, hematuria and flank pain.  Musculoskeletal: Negative for myalgias, back pain, joint pain and falls.  Skin: Negative for itching and rash.  Neurological: Negative for dizziness, tingling, tremors, sensory change, speech change, focal weakness, seizures, loss of consciousness, weakness and headaches.  Endo/Heme/Allergies: Negative for environmental allergies and polydipsia. Does not bruise/bleed easily.  Psychiatric/Behavioral: Negative for depression, suicidal ideas, hallucinations, memory loss and substance  abuse. The patient is not nervous/anxious and does not have insomnia.      PHYSICAL EXAM:  Blood pressure 99/60, pulse 64, temperature 98.5 F (36.9 C), temperature source Oral, resp. rate 20, height 4\' 11"  (1.499 m), weight 122 lb (55.339 kg), SpO2 100 %, not currently breastfeeding.    Vitals reviewed. Constitutional: She is oriented to person, place, and time. She appears well-developed and  well-nourished.  HENT:  Head: Normocephalic and atraumatic.  Right Ear: External ear normal.  Left Ear: External ear normal.  Nose: Nose normal.  Mouth/Throat: Oropharynx is clear and moist.  Eyes: Conjunctivae and EOM are normal. Pupils are equal, round, and reactive to light. Right eye exhibits no discharge. Left eye exhibits no discharge. No scleral icterus.  Neck: Normal range of motion. Neck supple. No tracheal deviation present. No thyromegaly present.  Cardiovascular: Normal rate, regular rhythm, normal heart sounds and intact distal pulses.  Exam reveals no gallop and no friction rub.   No murmur heard. Respiratory: Effort normal and breath sounds normal. No respiratory distress. She has no wheezes. She has no rales. She exhibits no tenderness.  GI: Soft. Bowel sounds are normal. She exhibits no distension and no mass. There is tenderness. There is no rebound and no guarding.  Genitourinary:       Vulva is significant for persistent left vulvar abscess Vagina is pink moist without discharge Cervix normal in appearance and pap is normal Uterus is normal Adnexa is negative with normal sized ovaries by sonogram  Musculoskeletal: Normal range of motion. She exhibits no edema and no tenderness.  Neurological: She is alert and oriented to person, place, and time. She has normal reflexes. She displays normal reflexes. No cranial nerve deficit. She exhibits normal muscle tone. Coordination normal.  Skin: Skin is warm and dry. No rash noted. No erythema. No pallor.  Psychiatric: She has a normal mood and affect. Her behavior is normal. Judgment and thought content normal.    Labs: Results for orders placed or performed during the hospital encounter of 09/10/14 (from the past 336 hour(s))  Urinalysis, Routine w reflex microscopic   Collection Time: 09/10/14 11:48 AM  Result Value Ref Range   Color, Urine YELLOW YELLOW   APPearance CLEAR CLEAR   Specific Gravity, Urine 1.025 1.005 -  1.030   pH 6.5 5.0 - 8.0   Glucose, UA NEGATIVE NEGATIVE mg/dL   Hgb urine dipstick NEGATIVE NEGATIVE   Bilirubin Urine NEGATIVE NEGATIVE   Ketones, ur NEGATIVE NEGATIVE mg/dL   Protein, ur NEGATIVE NEGATIVE mg/dL   Urobilinogen, UA 0.2 0.0 - 1.0 mg/dL   Nitrite NEGATIVE NEGATIVE   Leukocytes, UA NEGATIVE NEGATIVE  CBC   Collection Time: 09/10/14 12:05 PM  Result Value Ref Range   WBC 4.0 4.0 - 10.5 K/uL   RBC 4.77 3.87 - 5.11 MIL/uL   Hemoglobin 13.8 12.0 - 15.0 g/dL   HCT 16.141.3 09.636.0 - 04.546.0 %   MCV 86.6 78.0 - 100.0 fL   MCH 28.9 26.0 - 34.0 pg   MCHC 33.4 30.0 - 36.0 g/dL   RDW 40.913.2 81.111.5 - 91.415.5 %   Platelets 277 150 - 400 K/uL  Comprehensive metabolic panel   Collection Time: 09/10/14 12:05 PM  Result Value Ref Range   Sodium 140 137 - 147 mEq/L   Potassium 4.1 3.7 - 5.3 mEq/L   Chloride 106 96 - 112 mEq/L   CO2 21 19 - 32 mEq/L   Glucose, Bld 91 70 - 99 mg/dL  BUN 9 6 - 23 mg/dL   Creatinine, Ser 5.360.64 0.50 - 1.10 mg/dL   Calcium 9.0 8.4 - 64.410.5 mg/dL   Total Protein 6.8 6.0 - 8.3 g/dL   Albumin 3.9 3.5 - 5.2 g/dL   AST 17 0 - 37 U/L   ALT 20 0 - 35 U/L   Alkaline Phosphatase 89 39 - 117 U/L   Total Bilirubin 0.3 0.3 - 1.2 mg/dL   GFR calc non Af Amer >90 >90 mL/min   GFR calc Af Amer >90 >90 mL/min   Anion gap 13 5 - 15  hCG, quantitative, pregnancy   Collection Time: 09/10/14 12:05 PM  Result Value Ref Range   hCG, Beta Chain, Quant, S <1 <5 mIU/mL    EKG: Orders placed or performed in visit on 07/09/13  . Cardiac event monitor    Imaging Studies: No results found.    Assessment: Persistent left vulvar abscess Patient Active Problem List   Diagnosis Date Noted  . Monilial vulvovaginitis 01/21/2014  . Boil of vulva 01/18/2014  . Smoker 05/01/2013    Plan: Excision of vulvar abscess  EURE,LUTHER H 09/11/2014 9:59 AM

## 2014-09-11 NOTE — Transfer of Care (Signed)
Immediate Anesthesia Transfer of Care Note  Patient: [name]  Procedure(s) Performed: Procedure(s): EXCISION OF LEFT VULVAR ABSCESS WITH COMPLEX REPAIR (Left)  Patient Location: PACU  Anesthesia Type:General  Level of Consciousness: awake, alert , oriented and patient cooperative  Airway & Oxygen Therapy: Patient Spontanous Breathing  Post-op Assessment: Report given to PACU RN and Post -op Vital signs reviewed and stable  Post vital signs: Reviewed and stable  Complications: No apparent anesthesia complications

## 2014-09-11 NOTE — Anesthesia Postprocedure Evaluation (Signed)
  Anesthesia Post-op Note  Patient: [name]  Procedure(s) Performed: Procedure(s): EXCISION OF LEFT VULVAR ABSCESS WITH COMPLEX REPAIR (Left)  Patient Location: PACU  Anesthesia Type:General  Level of Consciousness: awake, alert , oriented and patient cooperative  Airway and Oxygen Therapy: Patient Spontanous Breathing  Post-op Pain: mild  Post-op Assessment: Post-op Vital signs reviewed, Patient's Cardiovascular Status Stable, Respiratory Function Stable, Patent Airway, No signs of Nausea or vomiting and Pain level controlled  Post-op Vital Signs: Reviewed and stable  Last Vitals:  Filed Vitals:   09/11/14 1119  BP: 101/64  Pulse:   Temp:   Resp: 15    Complications: No apparent anesthesia complications

## 2014-09-11 NOTE — Anesthesia Preprocedure Evaluation (Signed)
Anesthesia Evaluation  Patient identified by MRN, date of birth, ID band Patient awake    Reviewed: Allergy & Precautions, H&P , NPO status , Patient's Chart, lab work & pertinent test results  History of Anesthesia Complications Negative for: history of anesthetic complications  Airway Mallampati: I  TM Distance: >3 FB Neck ROM: full    Dental  (+) Teeth Intact   Pulmonary neg pulmonary ROS, former smoker,  breath sounds clear to auscultation        Cardiovascular negative cardio ROS  Rhythm:regular Rate:Normal     Neuro/Psych negative neurological ROS  negative psych ROS   GI/Hepatic negative GI ROS, Neg liver ROS, IBS   Endo/Other  negative endocrine ROS  Renal/GU negative Renal ROS     Musculoskeletal   Abdominal   Peds  Hematology negative hematology ROS (+)   Anesthesia Other Findings   Reproductive/Obstetrics                             Anesthesia Physical Anesthesia Plan  ASA: I  Anesthesia Plan: General   Post-op Pain Management:    Induction: Intravenous  Airway Management Planned: LMA  Additional Equipment:   Intra-op Plan:   Post-operative Plan: Extubation in OR  Informed Consent: I have reviewed the patients History and Physical, chart, labs and discussed the procedure including the risks, benefits and alternatives for the proposed anesthesia with the patient or authorized representative who has indicated his/her understanding and acceptance.     Plan Discussed with:   Anesthesia Plan Comments:         Anesthesia Quick Evaluation

## 2014-09-11 NOTE — Anesthesia Procedure Notes (Signed)
Procedure Name: LMA Insertion Date/Time: 09/11/2014 10:15 AM Performed by: Pernell DupreADAMS, AMY A Pre-anesthesia Checklist: Patient identified, Timeout performed, Emergency Drugs available, Suction available and Patient being monitored Patient Re-evaluated:Patient Re-evaluated prior to inductionOxygen Delivery Method: Circle system utilized Preoxygenation: Pre-oxygenation with 100% oxygen Intubation Type: IV induction Ventilation: Mask ventilation without difficulty LMA: LMA inserted LMA Size: 3.0 Number of attempts: 1 Placement Confirmation: positive ETCO2 and breath sounds checked- equal and bilateral Tube secured with: Tape Dental Injury: Teeth and Oropharynx as per pre-operative assessment

## 2014-09-11 NOTE — Telephone Encounter (Signed)
Silvadene refilled

## 2014-09-11 NOTE — Op Note (Signed)
Preoperative diagnosis:  Recurrent, persistent left vulvar abscess  Postoperative diagnosis:  Same as above  Procedure:  Partial vulvectomy, excision of vulvar abscess  Surgeon:  Lazaro ArmsEURE,LUTHER H   Anesthesia:  Laryngeal mask airway  Findings:    [name] has suffered with this left vulvar abscess now for the last several months.  It was actually an issue during her pregnancy as well. She has been on multiple courses of antibiotics and had a local IND in the office which calm that down for a couple of months but has since returned.  Most recently she has been on a course of doxycycline with topical Silvadene cream and it has made a significant difference but it is still prominent.  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  Description of operation:  Timeout was performed prior to the procedure with everyone in the room agreeing.  Patient was taken to the operating room placed in the supine position where she underwent general laryngeal mask airway anesthesia.  She was placed in the dorsal lithotomy position in candycane stirrups.  She was prepped and draped in the usual sterile fashion.    A 15 blade was used in an elliptical incision was made getting a wide normal tissue area around the vulvar abscess.  You could feel sort of a deep vulvar inflammatory/infectious area which was excised as well.  There was good bleeding of the tissue and all of the subcutaneous fat appeared to be healthy.   The partial vulvectomy was closed in 3 layers using 3-0 Monocryl for each layer.  The first 2 were subcutaneous and the third layer was performed in a subcuticular manner.  Hemostasis was achieved both with the electrocautery unit and with suturing.  Pressure was held on the area and good hemostasis was achieved.  17 mL of expiratory oh was injected into the site for postoperative  pain management.  Technical care was placed over the area to provide us several hours of ionic protection against bacteria.  The patient was awakened from anesthesia and taken to recovery in good stable condition.   ARE correct 3.  She will be seen back in the office in one week for initial postop evaluation.

## 2014-09-12 ENCOUNTER — Encounter (HOSPITAL_COMMUNITY): Payer: Self-pay | Admitting: Obstetrics & Gynecology

## 2014-09-16 ENCOUNTER — Telehealth: Payer: Self-pay | Admitting: Obstetrics & Gynecology

## 2014-09-16 NOTE — Telephone Encounter (Signed)
Pt states had a excision of vulvar abscess on 09/11/2014 had a BM this morning and strained due to constipation and noted small amount of blood when wiped from excision but not any bleeding since. Informed pt probably from straining with BM continue to monitor if happens again call office back to be seen but if not keep her post op appt for Thursday. Pt verbalized understanding.

## 2014-09-16 NOTE — Telephone Encounter (Signed)
Yeah that is ok, no problem

## 2014-09-19 ENCOUNTER — Encounter: Payer: Self-pay | Admitting: Obstetrics & Gynecology

## 2014-09-19 ENCOUNTER — Ambulatory Visit (INDEPENDENT_AMBULATORY_CARE_PROVIDER_SITE_OTHER): Payer: BC Managed Care – PPO | Admitting: Obstetrics & Gynecology

## 2014-09-19 VITALS — BP 94/60 | Wt 121.4 lb

## 2014-09-19 DIAGNOSIS — N764 Abscess of vulva: Secondary | ICD-10-CM

## 2014-09-19 DIAGNOSIS — Z9889 Other specified postprocedural states: Secondary | ICD-10-CM

## 2014-09-19 MED ORDER — HYDROCODONE-ACETAMINOPHEN 5-325 MG PO TABS: 1.0000 | ORAL_TABLET | Freq: Four times a day (QID) | ORAL | Status: DC | PRN

## 2014-09-19 NOTE — Progress Notes (Signed)
Patient ID: [name], female   DOB: 04-08-1991, 23 y.o.   MRN: [id number]  HPI: Patient returns for routine postoperative follow-up having undergone partial left vulvectomy on 09/11/2014. The patient's early postoperative recovery while in the hospital was notable for unremarkable. Since hospital discharge the patient reports normal post operative.   Current Outpatient Prescriptions  Medication Sig Dispense Refill  . HYDROcodone-acetaminophen (NORCO/VICODIN) 5-325 MG per tablet Take 1 tablet by mouth every 6 (six) hours as needed. 30 tablet 0  . medroxyPROGESTERone (DEPO-PROVERA) 150 MG/ML injection Inject 1 mL (150 mg total) into the muscle every 3 (three) months. 1 mL 3  . silver sulfADIAZINE (SILVADENE) 1 % cream Use to area BID 50 g 11  . acetaminophen (TYLENOL) 500 MG tablet Take 2,000 mg by mouth every 6 (six) hours as needed.    . doxycycline (VIBRAMYCIN) 50 MG capsule Take 2 capsules (100 mg total) by mouth 2 (two) times daily. (Patient not taking: Reported on 09/19/2014) 40 capsule 0  . ketorolac (TORADOL) 10 MG tablet Take 1 tablet (10 mg total) by mouth every 8 (eight) hours as needed. (Patient not taking: Reported on 09/19/2014) 15 tablet 0  . ondansetron (ZOFRAN) 8 MG tablet Take 1 tablet (8 mg total) by mouth every 8 (eight) hours as needed for nausea. (Patient not taking: Reported on 09/19/2014) 12 tablet 0   No current facility-administered medications for this visit.    Physical Exam: Left vulva superficial sutures have come out the deeper sutures and tissue is intact No infection normal local healing response Gentian violet placed loclly continue local care  Diagnostic Tests: none  Impression: S/P partial vulvectomy, left, secondary to persistent chronic left vulvar abscess  Plan: Follow up 2 weeks

## 2014-10-03 ENCOUNTER — Ambulatory Visit (INDEPENDENT_AMBULATORY_CARE_PROVIDER_SITE_OTHER): Payer: BC Managed Care – PPO | Admitting: Obstetrics & Gynecology

## 2014-10-03 ENCOUNTER — Ambulatory Visit: Payer: BC Managed Care – PPO

## 2014-10-03 ENCOUNTER — Encounter: Payer: Self-pay | Admitting: Obstetrics & Gynecology

## 2014-10-03 VITALS — BP 100/60 | Wt 122.0 lb

## 2014-10-03 DIAGNOSIS — Z9889 Other specified postprocedural states: Secondary | ICD-10-CM

## 2014-10-03 NOTE — Progress Notes (Signed)
Patient ID: Brandi SpanishMonica Strong, female   DOB: [birth year], 23 y.o.   MRN: [id number] Patient ID: Brandi SpanishMonica Strong, female   DOB: [birth year], 23 y.o.   MRN: [id number]  HPI: Patient returns for routine postoperative follow-up having undergone partial left vulvectomy on 09/11/2014. The patient's early postoperative recovery while in the hospital was notable for unremarkable. Since hospital discharge the patient reports normal post operative.   Current Outpatient Prescriptions  Medication Sig Dispense Refill  . HYDROcodone-acetaminophen (NORCO/VICODIN) 5-325 MG per tablet Take 1 tablet by mouth every 6 (six) hours as needed. 30 tablet 0  . silver sulfADIAZINE (SILVADENE) 1 % cream Use to area BID 50 g 11  . acetaminophen (TYLENOL) 500 MG tablet Take 2,000 mg by mouth every 6 (six) hours as needed.    . doxycycline (VIBRAMYCIN) 50 MG capsule Take 2 capsules (100 mg total) by mouth 2 (two) times daily. (Patient not taking: Reported on 09/19/2014) 40 capsule 0  . ketorolac (TORADOL) 10 MG tablet Take 1 tablet (10 mg total) by mouth every 8 (eight) hours as needed. (Patient not taking: Reported on 09/19/2014) 15 tablet 0  . medroxyPROGESTERone (DEPO-PROVERA) 150 MG/ML injection Inject 1 mL (150 mg total) into the muscle every 3 (three) months. (Patient not taking: Reported on 10/03/2014) 1 mL 3  . ondansetron (ZOFRAN) 8 MG tablet Take 1 tablet (8 mg total) by mouth every 8 (eight) hours as needed for nausea. (Patient not taking: Reported on 09/19/2014) 12 tablet 0   No current facility-administered medications for this visit.    Physical Exam: Left vulva superficial sutures have come out the deeper sutures and tissue is intact No infection normal local healing response Gentian violet placed loclly continue local care  Diagnostic Tests: none  Impression: S/P partial vulvectomy, left, secondary to persistent chronic left vulvar abscess  Plan: Follow up 6 weeks

## 2014-10-18 NOTE — Progress Notes (Signed)
Patient ID: [name], female   DOB: 04/15/1991, 24 y.o.   MRN: [id number]  Previous note from April  Unfortunately after an office excision the area is back Will try a trial of doxycycline and see back in 2 weeks, OR total excision if needed   Patient ID: [name], female   DOB: 04/15/1991, 24 y.o.   MRN: [id number] incsion clean dry intact No erythema No evidence of infection No swelling  Follow up 2 weeks  Past Medical History  Diagnosis Date  . HPV (human papilloma virus) infection   . Irritable bowel syndrome (IBS)   . Pregnant   . Abnormal Pap smear   . No pertinent past medical history     Past Surgical History  Procedure Laterality Date  . Wisdom tooth extraction    . Colonoscopy    . Cesarean section  08/03/2012    Procedure: CESAREAN SECTION;  Surgeon: Tilda BurrowJohn V Ferguson, MD;  Location: [facility];  Service: Obstetrics;  Laterality: N/A;  . Vulvar lesion removal Left 09/11/2014    Procedure: EXCISION OF LEFT VULVAR ABSCESS WITH COMPLEX REPAIR;  Surgeon: Lazaro ArmsLuther H Esgar Barnick, MD;  Location: [facility];  Service: Gynecology;  Laterality: Left;    OB History    Gravida Para Term Preterm AB TAB SAB Ectopic Multiple Living   2 2 2  0 0 0 0 0 0 2      Allergies  Allergen Reactions  . Sulfa Drugs Cross Reactors Hives    History   Social History  . Marital Status: Married    Spouse Name: N/A    Number of Children: N/A  . Years of Education: N/A   Social History Main Topics  . Smoking status: Former Smoker -- 0.25 packs/day for 7 years    Types: Cigarettes    Quit date: 02/08/2014  . Smokeless tobacco: Never Used  . Alcohol Use: Yes     Comment: once in a blue moon  . Drug Use: No  . Sexual Activity: Yes    Birth Control/ Protection: Injection   Other Topics Concern  . None   Social History Narrative    Family History  Problem Relation Age of Onset  . Other Neg Hx   . Miscarriages / [place] Mother   . Heart disease Maternal Grandmother   . Cancer Maternal  Grandfather     prostate  . Diabetes Paternal Grandmother

## 2014-11-14 ENCOUNTER — Ambulatory Visit: Payer: BC Managed Care – PPO | Admitting: Obstetrics & Gynecology

## 2015-01-09 ENCOUNTER — Ambulatory Visit (INDEPENDENT_AMBULATORY_CARE_PROVIDER_SITE_OTHER): Payer: BLUE CROSS/BLUE SHIELD | Admitting: Adult Health

## 2015-01-09 ENCOUNTER — Encounter: Payer: Self-pay | Admitting: Adult Health

## 2015-01-09 VITALS — BP 102/50 | HR 56 | Ht 59.0 in | Wt 125.5 lb

## 2015-01-09 DIAGNOSIS — R5383 Other fatigue: Secondary | ICD-10-CM

## 2015-01-09 DIAGNOSIS — N926 Irregular menstruation, unspecified: Secondary | ICD-10-CM | POA: Diagnosis not present

## 2015-01-09 DIAGNOSIS — Z3202 Encounter for pregnancy test, result negative: Secondary | ICD-10-CM

## 2015-01-09 DIAGNOSIS — R1032 Left lower quadrant pain: Secondary | ICD-10-CM

## 2015-01-09 HISTORY — DX: Irregular menstruation, unspecified: N92.6

## 2015-01-09 HISTORY — DX: Left lower quadrant pain: R10.32

## 2015-01-09 HISTORY — DX: Other fatigue: R53.83

## 2015-01-09 LAB — POCT URINE PREGNANCY: Preg Test, Ur: NEGATIVE

## 2015-01-09 NOTE — Progress Notes (Signed)
Subjective:     Patient ID: [name], female   DOB: 07-30-1991, 24 y.o.   MRN: [id number]  HPI [name] is a  24 year old white female, married in complaining of missed period and pain in left side x 1 week and is tired and has pain right arm at times where got last depo.Has only had 2 periods since stopping depo. Last depo in October and husband had vasectomy but did not go for final check.Has been stressed Mom had cardiac arrest and MI, but is doing well now.  Review of Systems  +missed period +pain in left side +pain in right arm at times +fatigue, all other systems negative  Reviewed past medical,surgical, social and family history. Reviewed medications and allergies.     Objective:   Physical Exam BP 102/50 mmHg  Pulse 56  Ht 4\' 11"  (1.499 m)  Wt 125 lb 8 oz (56.926 kg)  BMI 25.33 kg/m2  LMP 12/04/2014  Breastfeeding? No UPT negative, Skin warm and dry.Pelvic: external genitalia is normal in appearance no lesions, vagina: white discharge without odor,urethra has no lesions or masses noted, cervix:smooth and bulbous, uterus: normal size, shape and contour, non tender, no masses felt, adnexa: no masses, LLQ tenderness noted, no hernia noted. Bladder is non tender and no masses felt. Has good strength right arm and no mass in deltoid muscle or tenderness today.Discussed not unusual to have irregular periods after depo., will get labs to and US.    Assessment:     Missed period Fatigue LLQ pain    Plan:     Check CBC,CMP,TSH Return next week for gyn US to r/o ovarian cyst Will talk when all results in.

## 2015-01-09 NOTE — Patient Instructions (Signed)
Return next week for [place]  Will talk when results back Pelvic Pain Female pelvic pain can be caused by many different things and start from a variety of places. Pelvic pain refers to pain that is located in the lower half of the abdomen and between your hips. The pain may occur over a short period of time (acute) or may be reoccurring (chronic). The cause of pelvic pain may be related to disorders affecting the female reproductive organs (gynecologic), but it may also be related to the bladder, kidney stones, an intestinal complication, or muscle or skeletal problems. Getting help right away for pelvic pain is important, especially if there has been severe, sharp, or a sudden onset of unusual pain. It is also important to get help right away because some types of pelvic pain can be life threatening.  CAUSES  Below are only some of the causes of pelvic pain. The causes of pelvic pain can be in one of several categories.   Gynecologic.  Pelvic inflammatory disease.  Sexually transmitted infection.  Ovarian cyst or a twisted ovarian ligament (ovarian torsion).  Uterine lining that grows outside the uterus (endometriosis).  Fibroids, cysts, or tumors.  Ovulation.  Pregnancy.  Pregnancy that occurs outside the uterus (ectopic pregnancy).  Miscarriage.  Labor.  Abruption of the placenta or ruptured uterus.  Infection.  Uterine infection (endometritis).  Bladder infection.  Diverticulitis.  Miscarriage related to a uterine infection (septic abortion).  Bladder.  Inflammation of the bladder (cystitis).  Kidney stone(s).  Gastrointestinal.  Constipation.  Diverticulitis.  Neurologic.  Trauma.  Feeling pelvic pain because of mental or emotional causes (psychosomatic).  Cancers of the bowel or pelvis. EVALUATION  Your caregiver will want to take a careful history of your concerns. This includes recent changes in your health, a careful gynecologic history of your  periods (menses), and a sexual history. Obtaining your family history and medical history is also important. Your caregiver may suggest a pelvic exam. A pelvic exam will help identify the location and severity of the pain. It also helps in the evaluation of which organ system may be involved. In order to identify the cause of the pelvic pain and be properly treated, your caregiver may order tests. These tests may include:   A pregnancy test.  Pelvic ultrasonography.  An X-ray exam of the abdomen.  A urinalysis or evaluation of vaginal discharge.  Blood tests. HOME CARE INSTRUCTIONS   Only take over-the-counter or prescription medicines for pain, discomfort, or fever as directed by your caregiver.   Rest as directed by your caregiver.   Eat a balanced diet.   Drink enough fluids to make your urine clear or pale yellow, or as directed.   Avoid sexual intercourse if it causes pain.   Apply warm or cold compresses to the lower abdomen depending on which one helps the pain.   Avoid stressful situations.   Keep a journal of your pelvic pain. Write down when it started, where the pain is located, and if there are things that seem to be associated with the pain, such as food or your menstrual cycle.  Follow up with your caregiver as directed.  SEEK MEDICAL CARE IF:  Your medicine does not help your pain.  You have abnormal vaginal discharge. SEEK IMMEDIATE MEDICAL CARE IF:   You have heavy bleeding from the vagina.   Your pelvic pain increases.   You feel light-headed or faint.   You have chills.   You have pain with urination  or blood in your urine.   You have uncontrolled diarrhea or vomiting.   You have a fever or persistent symptoms for more than 3 days.  You have a fever and your symptoms suddenly get worse.   You are being physically or sexually abused.  MAKE SURE YOU:  Understand these instructions.  Will watch your condition.  Will get help  if you are not doing well or get worse. Document Released: 08/17/2004 Document Revised: 02/04/2014 Document Reviewed: 01/10/2012 [facility] Patient Information 2015 [employer] [place]. This information is not intended to replace advice given to you by your health care provider. Make sure you discuss any questions you have with your health care provider.

## 2015-01-10 ENCOUNTER — Telehealth: Payer: Self-pay | Admitting: Adult Health

## 2015-01-10 LAB — COMPREHENSIVE METABOLIC PANEL
A/G RATIO: 2.4 (ref 1.1–2.5)
ALT: 10 IU/L (ref 0–32)
AST: 14 IU/L (ref 0–40)
Albumin: 4.5 g/dL (ref 3.5–5.5)
Alkaline Phosphatase: 74 IU/L (ref 39–117)
BUN/Creatinine Ratio: 17 (ref 8–20)
BUN: 11 mg/dL (ref 6–20)
Bilirubin Total: 0.3 mg/dL (ref 0.0–1.2)
CALCIUM: 9.6 mg/dL (ref 8.7–10.2)
CO2: 22 mmol/L (ref 18–29)
Chloride: 103 mmol/L (ref 97–108)
Creatinine, Ser: 0.65 mg/dL (ref 0.57–1.00)
GFR calc Af Amer: 144 mL/min/{1.73_m2} (ref >59)
GFR, EST NON AFRICAN AMERICAN: 125 mL/min/{1.73_m2} (ref >59)
GLOBULIN, TOTAL: 1.9 g/dL (ref 1.5–4.5)
GLUCOSE: 84 mg/dL (ref 65–99)
Potassium: 4.9 mmol/L (ref 3.5–5.2)
Sodium: 140 mmol/L (ref 134–144)
TOTAL PROTEIN: 6.4 g/dL (ref 6.0–8.5)

## 2015-01-10 LAB — CBC
HEMATOCRIT: 40 % (ref 34.0–46.6)
Hemoglobin: 13.3 g/dL (ref 11.1–15.9)
MCH: 28.7 pg (ref 26.6–33.0)
MCHC: 33.3 g/dL (ref 31.5–35.7)
MCV: 86 fL (ref 79–97)
Platelets: 290 10*3/uL (ref 150–379)
RBC: 4.63 x10E6/uL (ref 3.77–5.28)
RDW: 13.4 % (ref 12.3–15.4)
WBC: 7.2 10*3/uL (ref 3.4–10.8)

## 2015-01-10 LAB — TSH: TSH: 1.79 u[IU]/mL (ref 0.450–4.500)

## 2015-01-10 NOTE — Telephone Encounter (Signed)
Left message labs normal

## 2015-01-13 ENCOUNTER — Other Ambulatory Visit: Payer: Self-pay | Admitting: Adult Health

## 2015-01-13 DIAGNOSIS — R1032 Left lower quadrant pain: Secondary | ICD-10-CM

## 2015-01-14 ENCOUNTER — Encounter: Payer: Self-pay | Admitting: Obstetrics & Gynecology

## 2015-01-14 ENCOUNTER — Other Ambulatory Visit: Payer: BLUE CROSS/BLUE SHIELD

## 2015-07-04 ENCOUNTER — Telehealth: Payer: Self-pay | Admitting: *Deleted

## 2015-07-04 NOTE — Telephone Encounter (Signed)
Pt called requesting her last lab resutls from 01/09/2015. Pt informed all her labs CMP, CBC, TSH was WNL. Pt verbalized understanding.

## 2016-03-05 ENCOUNTER — Emergency Department (HOSPITAL_COMMUNITY)
Admission: EM | Admit: 2016-03-05 | Discharge: 2016-03-05 | Disposition: A | Discharge status: Home / Self Care | Payer: BLUE CROSS/BLUE SHIELD | Attending: Emergency Medicine | Admitting: Emergency Medicine

## 2016-03-05 ENCOUNTER — Emergency Department (HOSPITAL_COMMUNITY): Payer: BLUE CROSS/BLUE SHIELD

## 2016-03-05 ENCOUNTER — Encounter (HOSPITAL_COMMUNITY): Payer: Self-pay | Admitting: *Deleted

## 2016-03-05 DIAGNOSIS — N898 Other specified noninflammatory disorders of vagina: Secondary | ICD-10-CM | POA: Diagnosis not present

## 2016-03-05 DIAGNOSIS — R52 Pain, unspecified: Secondary | ICD-10-CM

## 2016-03-05 DIAGNOSIS — Z87891 Personal history of nicotine dependence: Secondary | ICD-10-CM | POA: Insufficient documentation

## 2016-03-05 DIAGNOSIS — Z791 Long term (current) use of non-steroidal anti-inflammatories (NSAID): Secondary | ICD-10-CM | POA: Diagnosis not present

## 2016-03-05 DIAGNOSIS — B9689 Other specified bacterial agents as the cause of diseases classified elsewhere: Secondary | ICD-10-CM

## 2016-03-05 DIAGNOSIS — R1030 Lower abdominal pain, unspecified: Secondary | ICD-10-CM | POA: Diagnosis present

## 2016-03-05 DIAGNOSIS — R109 Unspecified abdominal pain: Secondary | ICD-10-CM

## 2016-03-05 DIAGNOSIS — R102 Pelvic and perineal pain: Secondary | ICD-10-CM | POA: Diagnosis not present

## 2016-03-05 DIAGNOSIS — N760 Acute vaginitis: Secondary | ICD-10-CM

## 2016-03-05 DIAGNOSIS — R1032 Left lower quadrant pain: Secondary | ICD-10-CM | POA: Diagnosis not present

## 2016-03-05 LAB — URINALYSIS, ROUTINE W REFLEX MICROSCOPIC
Bilirubin Urine: NEGATIVE
Glucose, UA: NEGATIVE mg/dL
Hgb urine dipstick: NEGATIVE
Ketones, ur: NEGATIVE mg/dL
NITRITE: NEGATIVE
Protein, ur: NEGATIVE mg/dL
pH: 5.5 (ref 5.0–8.0)

## 2016-03-05 LAB — URINE MICROSCOPIC-ADD ON: RBC / HPF: NONE SEEN RBC/hpf (ref 0–5)

## 2016-03-05 LAB — CBC WITH DIFFERENTIAL/PLATELET
Basophils Absolute: 0 10*3/uL (ref 0.0–0.1)
Basophils Relative: 0 %
Eosinophils Absolute: 0 10*3/uL (ref 0.0–0.7)
Eosinophils Relative: 0 %
HCT: 34.4 % — ABNORMAL LOW (ref 36.0–46.0)
HEMOGLOBIN: 11.4 g/dL — AB (ref 12.0–15.0)
LYMPHS ABS: 0.9 10*3/uL (ref 0.7–4.0)
Lymphocytes Relative: 9 %
MCH: 29.5 pg (ref 26.0–34.0)
MCHC: 33.1 g/dL (ref 30.0–36.0)
MCV: 89.1 fL (ref 78.0–100.0)
MONO ABS: 0.7 10*3/uL (ref 0.1–1.0)
Monocytes Relative: 7 %
NEUTROS ABS: 8.5 10*3/uL — AB (ref 1.7–7.7)
NEUTROS PCT: 84 %
Platelets: 305 10*3/uL (ref 150–400)
RBC: 3.86 MIL/uL — ABNORMAL LOW (ref 3.87–5.11)
RDW: 13 % (ref 11.5–15.5)
WBC: 10.2 10*3/uL (ref 4.0–10.5)

## 2016-03-05 LAB — COMPREHENSIVE METABOLIC PANEL
ALK PHOS: 71 U/L (ref 38–126)
ALT: 15 U/L (ref 14–54)
ANION GAP: 6 (ref 5–15)
AST: 17 U/L (ref 15–41)
Albumin: 3.8 g/dL (ref 3.5–5.0)
BILIRUBIN TOTAL: 0.6 mg/dL (ref 0.3–1.2)
BUN: 12 mg/dL (ref 6–20)
CALCIUM: 8.9 mg/dL (ref 8.9–10.3)
CO2: 27 mmol/L (ref 22–32)
Chloride: 105 mmol/L (ref 101–111)
Creatinine, Ser: 0.72 mg/dL (ref 0.44–1.00)
GLUCOSE: 97 mg/dL (ref 65–99)
Potassium: 3.9 mmol/L (ref 3.5–5.1)
Sodium: 138 mmol/L (ref 135–145)
TOTAL PROTEIN: 6.8 g/dL (ref 6.5–8.1)

## 2016-03-05 LAB — WET PREP, GENITAL
Sperm: NONE SEEN
Trich, Wet Prep: NONE SEEN
Yeast Wet Prep HPF POC: NONE SEEN

## 2016-03-05 LAB — I-STAT BETA HCG BLOOD, ED (MC, WL, AP ONLY)

## 2016-03-05 MED ORDER — ONDANSETRON HCL 4 MG/2ML IJ SOLN
4.0000 mg | Freq: Once | INTRAMUSCULAR | Status: DC
Start: 2016-03-05 — End: 2016-03-05
  Filled 2016-03-05: qty 2

## 2016-03-05 MED ORDER — SODIUM CHLORIDE 0.9 % IV BOLUS (SEPSIS): 1000.0000 mL | Freq: Once | INTRAVENOUS | Status: DC

## 2016-03-05 MED ORDER — TRAMADOL HCL 50 MG PO TABS: 50.0000 mg | ORAL_TABLET | Freq: Four times a day (QID) | ORAL | Status: DC | PRN

## 2016-03-05 MED ORDER — METRONIDAZOLE 500 MG PO TABS: 500.0000 mg | ORAL_TABLET | Freq: Two times a day (BID) | ORAL | Status: DC

## 2016-03-05 MED ORDER — HYDROMORPHONE HCL 1 MG/ML IJ SOLN
1.0000 mg | Freq: Once | INTRAMUSCULAR | Status: DC
  Filled 2016-03-05: qty 1

## 2016-03-05 NOTE — ED Notes (Signed)
Patient with no complaints at this time. Respirations even and unlabored. Skin warm/dry. Discharge instructions reviewed with patient at this time. Patient given opportunity to voice concerns/ask questions. Patient discharged at this time and left Emergency Department with steady gait.   

## 2016-03-05 NOTE — ED Notes (Signed)
Patient states she took a laxative this morning. Had a dizziness after using the restroom a few minutes ago. RN made aware. Orthostatics obtained at this time.

## 2016-03-05 NOTE — ED Notes (Signed)
Patient refuses IV, NS bolus, dilaudid, and zofran. Patient states she wants to leave.

## 2016-03-05 NOTE — ED Notes (Signed)
Pt comes in for lower abdominal pain starting yesterday morning. Pt has history of IBS. States she had a normal bowel movement yesterday. Denies any n/v/d. States she has had trouble urinating the past few days. NAD noted.

## 2016-03-05 NOTE — ED Provider Notes (Signed)
CSN: [id number]     Arrival date & time 03/05/16  4098 History  By signing my name below, I, Iona Beard, attest that this documentation has been prepared under the direction and in the presence of Bethann Berkshire, MD.   Electronically Signed: Iona Beard, ED Scribe. 03/05/2016. 8:23 AM   Chief Complaint  Patient presents with  . Abdominal Pain   Patient is a 25 y.o. female presenting with abdominal pain. The history is provided by the patient. No language interpreter was used.  Abdominal Pain Pain location:  Suprapubic Pain radiates to:  Does not radiate Pain severity:  Mild Onset quality:  Gradual Duration:  2 days Timing:  Constant Progression:  Worsening Chronicity:  New Relieved by:  Nothing Worsened by:  Nothing tried Ineffective treatments:  None tried Associated symptoms: nausea   Associated symptoms: no chest pain, no chills, no cough, no diarrhea, no fatigue, no fever, no hematuria and no vomiting    HPI Comments: [name] is a 25 y.o. female who presents to the Emergency Department complaining of gradual onset, abdominal pain, beginning yesterday. Pt reports associated nausea. No other associated symptoms noted. Pt states the pain is worse when she stands up. No other worsening or alleviating factors noted. Pt denies diarrhea, emesis, fever, chills, or any other pertinent symptoms. LNMP was a couple of weeks ago.  Past Medical History  Diagnosis Date  . HPV (human papilloma virus) infection   . Irritable bowel syndrome (IBS)   . Pregnant   . Abnormal Pap smear   . No pertinent past medical history   . Vaginal Pap smear, abnormal   . Fatigue 01/09/2015  . Missed period 01/09/2015  . LLQ pain 01/09/2015   Past Surgical History  Procedure Laterality Date  . Wisdom tooth extraction    . Colonoscopy    . Cesarean section  08/03/2012    Procedure: CESAREAN SECTION;  Surgeon: Tilda Burrow, MD;  Location: [facility];  Service: Obstetrics;  Laterality: N/A;  .  Vulvar lesion removal Left 09/11/2014    Procedure: EXCISION OF LEFT VULVAR ABSCESS WITH COMPLEX REPAIR;  Surgeon: Lazaro Arms, MD;  Location: [facility];  Service: Gynecology;  Laterality: Left;   Family History  Problem Relation Age of Onset  . Other Neg Hx   . Miscarriages / [place] Mother   . Heart attack Mother     cardiac arrest with heart attack  . Heart disease Maternal Grandmother   . Cancer Maternal Grandfather     prostate  . Heart disease Maternal Grandfather   . Diabetes Paternal Grandmother   . Heart murmur Father    Social History  Substance Use Topics  . Smoking status: Former Smoker -- 0.25 packs/day for 7 years    Types: Cigarettes    Quit date: 02/08/2014  . Smokeless tobacco: Never Used  . Alcohol Use: Yes     Comment: once in a blue moon   OB History    Gravida Para Term Preterm AB TAB SAB Ectopic Multiple Living   0 0 0 0 0 0 2     Review of Systems  Constitutional: Negative for fever, chills, appetite change and fatigue.  HENT: Negative for congestion, ear discharge and sinus pressure.   Eyes: Negative for discharge.  Respiratory: Negative for cough.   Cardiovascular: Negative for chest pain.  Gastrointestinal: Positive for nausea and abdominal pain. Negative for vomiting and diarrhea.  Genitourinary: Negative for frequency and hematuria.  Musculoskeletal: Negative  for back pain.  Skin: Negative for rash.  Neurological: Negative for seizures and headaches.  Psychiatric/Behavioral: Negative for hallucinations.      Allergies  Sulfa drugs cross reactors  Home Medications   Prior to Admission medications   Medication Sig Start Date End Date Taking? Authorizing Provider  ibuprofen (ADVIL,MOTRIN) 200 MG tablet Take 200 mg by mouth as needed.    Historical Provider, MD  silver sulfADIAZINE (SILVADENE) 1 % cream Use to area BID Patient taking differently: 1 application daily. Use to area BID 09/11/14   Lazaro ArmsLuther H Eure, MD   BP 113/55 mmHg   Pulse 96  Temp(Src) 98.5 F (36.9 C) (Oral)  Resp 18  Ht 4\' 11"  (1.499 m)  Wt 125 lb (56.7 kg)  BMI 25.23 kg/m2  SpO2 98% Physical Exam  Constitutional: She is oriented to person, place, and time. She appears well-developed.  HENT:  Head: Normocephalic.  Eyes: Conjunctivae and EOM are normal. No scleral icterus.  Neck: Neck supple. No thyromegaly present.  Cardiovascular: Normal rate and regular rhythm.  Exam reveals no gallop and no friction rub.   No murmur heard. Pulmonary/Chest: No stridor. She has no wheezes. She has no rales. She exhibits no tenderness.  Abdominal: She exhibits no distension. There is tenderness. There is no rebound.  Mild suprapubic TTP.  Genitourinary:  Tender cervix and left adenexal  Musculoskeletal: Normal range of motion. She exhibits no edema.  Lymphadenopathy:    She has no cervical adenopathy.  Neurological: She is oriented to person, place, and time. She exhibits normal muscle tone. Coordination normal.  Skin: No rash noted. No erythema.  Psychiatric: She has a normal mood and affect. Her behavior is normal.    ED Course  Procedures (including critical care time) DIAGNOSTIC STUDIES: Oxygen Saturation is 98% on RA, normal by my interpretation.    COORDINATION OF CARE: 8:23 AM Discussed treatment plan with pt at bedside and pt agreed to plan.  Labs Review Labs Reviewed  URINALYSIS, ROUTINE W REFLEX MICROSCOPIC (NOT AT [facility])  CBC WITH DIFFERENTIAL/PLATELET  COMPREHENSIVE METABOLIC PANEL  I-STAT BETA HCG BLOOD, ED ([facility], [facility], [facility] ONLY)    Imaging Review No results found. I have personally reviewed and evaluated these images and lab results as part of my medical decision-making.   EKG Interpretation None      MDM   Final diagnoses:  None   Patient with suprapubic and left lower quadrant abdominal pain. Labs unremarkable except for bacterial vaginosis. Ultrasound of pelvic normal. Patient does have a history of irritable bowel. Doubt  appendicitis. Patient will be sent home with Flagyl and tramadol she will follow-up with her doctor next week  The chart was scribed for me under my direct supervision.  I personally performed the history, physical, and medical decision making and all procedures in the evaluation of this patient.Bethann Berkshire.    Pancho Rushing, MD 03/05/16 [phone]

## 2016-03-05 NOTE — Discharge Instructions (Signed)
Follow up with Dr. Ferguson next week. °

## 2016-03-08 LAB — GC/CHLAMYDIA PROBE AMP (~~LOC~~) NOT AT ARMC
Chlamydia: NEGATIVE
NEISSERIA GONORRHEA: NEGATIVE

## 2016-03-19 ENCOUNTER — Telehealth: Payer: Self-pay | Admitting: *Deleted

## 2016-03-19 NOTE — Telephone Encounter (Signed)
Pt states was given an ABX at the ER, now thinks she has a yeast infection, taking monistat, now started her period. Should pt use tampons or pads with yeast infection. Pt advised to use pads and informed if no improvement with monistat can call and make an appt for evaluation.

## 2017-10-24 DIAGNOSIS — J209 Acute bronchitis, unspecified: Secondary | ICD-10-CM | POA: Diagnosis not present

## 2017-10-24 DIAGNOSIS — J329 Chronic sinusitis, unspecified: Secondary | ICD-10-CM | POA: Diagnosis not present

## 2018-02-13 DIAGNOSIS — M545 Low back pain: Secondary | ICD-10-CM | POA: Diagnosis not present

## 2018-02-13 DIAGNOSIS — K581 Irritable bowel syndrome with constipation: Secondary | ICD-10-CM | POA: Diagnosis not present

## 2018-02-13 DIAGNOSIS — Z0000 Encounter for general adult medical examination without abnormal findings: Secondary | ICD-10-CM | POA: Diagnosis not present

## 2018-02-13 DIAGNOSIS — Z6825 Body mass index (BMI) 25.0-25.9, adult: Secondary | ICD-10-CM | POA: Diagnosis not present

## 2018-02-13 DIAGNOSIS — H5711 Ocular pain, right eye: Secondary | ICD-10-CM | POA: Diagnosis not present

## 2019-09-07 ENCOUNTER — Emergency Department (HOSPITAL_COMMUNITY)
Admission: EM | Admit: 2019-09-07 | Discharge: 2019-09-08 | Disposition: A | Discharge status: Home / Self Care | Payer: BC Managed Care – PPO | Attending: Emergency Medicine | Admitting: Emergency Medicine

## 2019-09-07 ENCOUNTER — Encounter (HOSPITAL_COMMUNITY): Payer: Self-pay

## 2019-09-07 ENCOUNTER — Other Ambulatory Visit: Payer: Self-pay

## 2019-09-07 DIAGNOSIS — Z87891 Personal history of nicotine dependence: Secondary | ICD-10-CM | POA: Insufficient documentation

## 2019-09-07 DIAGNOSIS — B349 Viral infection, unspecified: Secondary | ICD-10-CM | POA: Insufficient documentation

## 2019-09-07 DIAGNOSIS — R42 Dizziness and giddiness: Secondary | ICD-10-CM

## 2019-09-07 DIAGNOSIS — Z20822 Contact with and (suspected) exposure to covid-19: Secondary | ICD-10-CM

## 2019-09-07 DIAGNOSIS — Z20828 Contact with and (suspected) exposure to other viral communicable diseases: Secondary | ICD-10-CM | POA: Diagnosis not present

## 2019-09-07 LAB — URINALYSIS, ROUTINE W REFLEX MICROSCOPIC
Bacteria, UA: NONE SEEN
Bilirubin Urine: NEGATIVE
Glucose, UA: NEGATIVE mg/dL
Hgb urine dipstick: NEGATIVE
Ketones, ur: NEGATIVE mg/dL
Nitrite: NEGATIVE
Protein, ur: NEGATIVE mg/dL
Specific Gravity, Urine: 1.006 (ref 1.005–1.030)
pH: 6 (ref 5.0–8.0)

## 2019-09-07 LAB — I-STAT CHEM 8, ED
BUN: 9 mg/dL (ref 6–20)
Calcium, Ion: 1.19 mmol/L (ref 1.15–1.40)
Chloride: 103 mmol/L (ref 98–111)
Creatinine, Ser: 0.7 mg/dL (ref 0.44–1.00)
Glucose, Bld: 95 mg/dL (ref 70–99)
HCT: 41 % (ref 36.0–46.0)
Hemoglobin: 13.9 g/dL (ref 12.0–15.0)
Potassium: 3.6 mmol/L (ref 3.5–5.1)
Sodium: 138 mmol/L (ref 135–145)
TCO2: 25 mmol/L (ref 22–32)

## 2019-09-07 LAB — PREGNANCY, URINE: Preg Test, Ur: NEGATIVE

## 2019-09-07 NOTE — ED Triage Notes (Addendum)
Pt reports dizziness that started a few days ago, pt denies cough, says, "throat feels weird but doesn't hurt", chills, and lower back pain. Pt also reports "shooting pains to different parts of body" "that come and go rapidly". Pt also reports being around someone at Thanksgiving that tested positive for Covid, pt was tested today @[facility]  outpatient Covid testing.

## 2019-09-08 NOTE — ED Notes (Signed)
Pt ambulatory with no distress noted.

## 2019-09-08 NOTE — ED Provider Notes (Signed)
[facility] EMERGENCY DEPARTMENT Provider Note   CSN: [id number] Arrival date & time: 09/07/19  1845     History   Chief Complaint Chief Complaint  Patient presents with  . Dizziness    HPI [name] is a 28 y.o. female.     HPI   [name] is a 28 y.o. female who presents to the Emergency Department complaining of dizziness associated with standing, body aches, sore throat and chills.  Symptoms have been present for 4 days.  She describes having a "feeling of being unsteady" upon standing that resolves at rest.  She states that she has family members and other recent close contacts that have tested positive for COVID 19.  She also describes having sharp "shooting pains in different places" that occur in her legs, back and arms.  Pains are intermittent and she describes the pain to her lower back as radiating into her left buttock.  She also is requestign to have her urine tested, stating that is concerned about possible infection although she denies dysuria.  She denies cough, shortness of breath, fever, abdominal pain, nausea, vomiting and diarrhea. She received a COVID test earlier today and results are pending.     Past Medical History:  Diagnosis Date  . Abnormal Pap smear   . Fatigue 01/09/2015  . HPV (human papilloma virus) infection   . Irritable bowel syndrome (IBS)   . LLQ pain 01/09/2015  . Missed period 01/09/2015  . No pertinent past medical history   . Pregnant   . Vaginal Pap smear, abnormal     Patient Active Problem List   Diagnosis Date Noted  . Fatigue 01/09/2015  . Missed period 01/09/2015  . LLQ pain 01/09/2015  . Monilial vulvovaginitis 01/21/2014  . Boil of vulva 01/18/2014  . Smoker 05/01/2013    Past Surgical History:  Procedure Laterality Date  . CESAREAN SECTION  08/03/2012   Procedure: CESAREAN SECTION;  Surgeon: Jonnie Kind, MD;  Location: [place] [facility];  Service: Obstetrics;  Laterality: N/A;  . COLONOSCOPY    . VULVAR LESION REMOVAL  Left 09/11/2014   Procedure: EXCISION OF LEFT VULVAR ABSCESS WITH COMPLEX REPAIR;  Surgeon: Florian Buff, MD;  Location: [facility];  Service: Gynecology;  Laterality: Left;  . WISDOM TOOTH EXTRACTION       OB History    Gravida  2   Para  2   Term  2   Preterm  0   AB  0   Living  2     SAB  0   TAB  0   Ectopic  0   Multiple  0   Live Births  2            Home Medications    Prior to Admission medications   Medication Sig Start Date End Date Taking? Authorizing Provider  acetaminophen (TYLENOL) 500 MG tablet Take 500 mg by mouth every 6 (six) hours as needed.    [provider]  ibuprofen (ADVIL,MOTRIN) 200 MG tablet Take 200 mg by mouth as needed.    [provider]  metroNIDAZOLE (FLAGYL) 500 MG tablet Take 1 tablet (500 mg total) by mouth 2 (two) times daily. One po bid x 7 days 03/05/16   Milton Ferguson, MD  silver sulfADIAZINE (SILVADENE) 1 % cream Use to area BID Patient not taking: Reported on 03/05/2016 09/11/14   Florian Buff, MD  traMADol (ULTRAM) 50 MG tablet Take 1 tablet (50 mg total) by mouth  every 6 (six) hours as needed. 03/05/16   Bethann BerkshireZammit, Joseph, MD    Family History Family History  Problem Relation Age of Onset  . Miscarriages / [place] Mother   . Heart attack Mother        cardiac arrest with heart attack  . Heart disease Maternal Grandmother   . Cancer Maternal Grandfather        prostate  . Heart disease Maternal Grandfather   . Diabetes Paternal Grandmother   . Heart murmur Father   . Other Neg Hx     Social History Social History   Tobacco Use  . Smoking status: Former Smoker    Packs/day: 0.25    Years: 7.00    Pack years: 1.75    Types: Cigarettes    Quit date: 02/08/2014    Years since quitting: 5.5  . Smokeless tobacco: Never Used  Substance Use Topics  . Alcohol use: Yes    Comment: weekly- couple drinks a week   . Drug use: No     Allergies   Sulfa drugs cross reactors   Review of Systems  Review of Systems  Constitutional: Positive for chills. Negative for activity change, appetite change and fever.  HENT: Positive for sore throat. Negative for congestion, facial swelling, rhinorrhea and trouble swallowing.   Eyes: Negative for visual disturbance.  Respiratory: Negative for cough, chest tightness, shortness of breath, wheezing and stridor.   Gastrointestinal: Negative for abdominal pain, diarrhea, nausea and vomiting.  Genitourinary: Negative for decreased urine volume, dysuria, flank pain, vaginal bleeding and vaginal discharge.  Musculoskeletal: Negative for neck pain and neck stiffness.  Skin: Negative for rash.  Neurological: Positive for dizziness. Negative for syncope, weakness, numbness and headaches.  Hematological: Negative for adenopathy.  Psychiatric/Behavioral: Negative for confusion.     Physical Exam Updated Vital Signs BP 112/78 (BP Location: Right Arm)   Pulse 71   Temp 98.8 F (37.1 C)   Resp 19   Ht 4\' 11"  (1.499 m)   Wt 58.1 kg   LMP 08/14/2019 (Approximate)   SpO2 100%   BMI 25.85 kg/m   Physical Exam Vitals signs and nursing note reviewed.  Constitutional:      General: She is not in acute distress.    Appearance: Normal appearance. She is not ill-appearing.  HENT:     Head: Atraumatic.     Mouth/Throat:     Mouth: Mucous membranes are moist.     Pharynx: No oropharyngeal exudate or posterior oropharyngeal erythema.     Comments: Uvula is midline and non-edematous.  No erythema or edema of the oropharynx.  No exudates.  Eyes:     Extraocular Movements: Extraocular movements intact.     Pupils: Pupils are equal, round, and reactive to light.  Neck:     Musculoskeletal: Normal range of motion. No neck rigidity or muscular tenderness.  Cardiovascular:     Rate and Rhythm: Normal rate and regular rhythm.     Pulses: Normal pulses.  Pulmonary:     Effort: Pulmonary effort is normal.     Breath sounds: Normal breath sounds. No  wheezing, rhonchi or rales.  Abdominal:     General: There is no distension.     Palpations: Abdomen is soft.     Tenderness: There is no abdominal tenderness. There is no guarding or rebound.  Musculoskeletal: Normal range of motion.     Right lower leg: No edema.     Left lower leg: No edema.  Comments: Mild ttp of the left SI joint space.  No midline spinal tenderness.  Neg SLR bilaterally  Lymphadenopathy:     Cervical: No cervical adenopathy.  Skin:    General: Skin is warm.     Capillary Refill: Capillary refill takes less than 2 seconds.  Neurological:     General: No focal deficit present.     Mental Status: She is alert.     GCS: GCS eye subscore is 4. GCS verbal subscore is 5. GCS motor subscore is 6.     Sensory: Sensation is intact. No sensory deficit.     Motor: Motor function is intact. No weakness.     Coordination: Coordination is intact.     Gait: Gait is intact.     Comments: CN II-XII intact.  Speech clear.  No pronator drift.  nml finger-nose testing.        ED Treatments / Results  Labs (all labs ordered are listed, but only abnormal results are displayed) Labs Reviewed  URINALYSIS, ROUTINE W REFLEX MICROSCOPIC - Abnormal; Notable for the following components:      Result Value   Color, Urine STRAW (*)    Leukocytes,Ua TRACE (*)    All other components within normal limits  PREGNANCY, URINE  I-STAT CHEM 8, ED    EKG None  Radiology No results found.  Procedures Procedures (including critical care time)  Medications Ordered in ED Medications - No data to display   Initial Impression / Assessment and Plan / ED Course  I have reviewed the triage vital signs and the nursing notes.  Pertinent labs & imaging results that were available during my care of the patient were reviewed by me and considered in my medical decision making (see chart for details).        Pt with recent COVID exposures.  Had COVID out patient testing earlier today  and results are pending.  No focal neuro deficits on exam.  Ambulated in the dept and gait steady.  No evidence of UTI. vitals reassuring.  No orthostatic hypotension.  Pt is well appearing. Sx's likely related to viral illness.  Pt agrees to isolate at home until test results back.  Return precautions discussed.    Final Clinical Impressions(s) / ED Diagnoses   Final diagnoses:  Viral illness  Dizziness    ED Discharge Orders    None       Pauline Aus, PA-C 09/08/19 0118    Sabas Sous, MD 09/08/19 1105

## 2019-09-08 NOTE — Discharge Instructions (Addendum)
Tylenol or ibuprofen if needed for body aches and/or fever.  Continue fluids.  Isolate at home until your test results are back and 10 days if results are positive.  Return to ER for any worsening symptoms

## 2019-09-09 LAB — NOVEL CORONAVIRUS, NAA: SARS-CoV-2, NAA: NOT DETECTED

## 2019-09-12 DIAGNOSIS — R42 Dizziness and giddiness: Secondary | ICD-10-CM | POA: Diagnosis not present

## 2019-09-18 DIAGNOSIS — R42 Dizziness and giddiness: Secondary | ICD-10-CM | POA: Diagnosis not present

## 2019-09-18 DIAGNOSIS — G4489 Other headache syndrome: Secondary | ICD-10-CM | POA: Diagnosis not present

## 2019-09-26 DIAGNOSIS — Z124 Encounter for screening for malignant neoplasm of cervix: Secondary | ICD-10-CM | POA: Diagnosis not present

## 2019-09-26 DIAGNOSIS — K581 Irritable bowel syndrome with constipation: Secondary | ICD-10-CM | POA: Diagnosis not present

## 2019-09-26 DIAGNOSIS — Z0001 Encounter for general adult medical examination with abnormal findings: Secondary | ICD-10-CM | POA: Diagnosis not present

## 2019-09-26 DIAGNOSIS — M545 Low back pain: Secondary | ICD-10-CM | POA: Diagnosis not present

## 2019-09-26 DIAGNOSIS — R42 Dizziness and giddiness: Secondary | ICD-10-CM | POA: Diagnosis not present

## 2019-10-02 NOTE — Progress Notes (Signed)
CARDIOLOGY CONSULT NOTE       Patient ID: [name] MRN: [id number] DOB/AGE: 28-16-1992 28 y.o.  Admit date: (Not on file) Referring Physician: Pauline Aus PA-C [facility]  Primary Physician: Tilda Burrow, MD Primary Cardiologist: New Reason for Consultation: Dizziness     HPI:  28 y.o. referred by [facility] Tammy Triplett for dizziness Reviewed ER note from 09/07/19 She complained of constitutional symptoms which included chills, sore throat, myalgias and dizziness when standing Felt unsteady standing Symptoms started 4 days prior to ER visit Has had contacts test positive for COVID Some sharp shooting pains in back radiating to buttock on left She was not orthostatic UA, COVID and pregnancy tests negative Telemetry in ER was NSR including when dizzy She has been seen in past by Dr Darl Householder for palpitations with benign event monitor 2014.    Since ER visit feels ok Took Zyrtec and Amoxacillin for fluid in her ear. 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 Abnormal Pap smear   . Fatigue 01/09/2015  . HPV (human papilloma virus) infection   . Irritable bowel syndrome (IBS)   . LLQ pain 01/09/2015  . Missed period 01/09/2015  . No pertinent past medical history   . Pregnant   . Vaginal Pap smear, abnormal     Family History  Problem Relation Age of Onset  . Miscarriages / [place] Mother   . Heart attack Mother        cardiac arrest with heart attack  . Heart disease Maternal Grandmother   . Cancer Maternal Grandfather        prostate  . Heart disease Maternal Grandfather   . Diabetes Paternal Grandmother   . Heart murmur Father   . Other Neg Hx     Social History   Socioeconomic History  . Marital status: Married    Spouse name: Not on file  . Number of children: Not on file    . Years of education: Not on file  . Highest education level: Not on file  Occupational History  . Not on file  Tobacco Use  . Smoking status: Former Smoker    Packs/day: 0.25    Years: 7.00    Pack years: 1.75    Types: Cigarettes    Quit date: 02/08/2014    Years since quitting: 5.6  . Smokeless tobacco: Never Used  Substance and Sexual Activity  . Alcohol use: Yes    Comment: weekly- couple drinks a week   . Drug use: No  . Sexual activity: Yes    Birth control/protection: Surgical    Comment: husband had vasectomy  Other Topics Concern  . Not on file  Social History Narrative  . Not on file   Social Determinants of Health   Financial Resource Strain:   . Difficulty of Paying Living Expenses: Not on file  Food Insecurity:   . Worried About Programme researcher, broadcasting/film/video in the Last Year: Not on file  . Ran Out of Food in the Last Year: Not on file  Transportation Needs:   . Lack of Transportation (Medical): Not on file  . Lack of Transportation (Non-Medical): Not on file  Physical Activity:   . Days of Exercise per Week: Not on file  . Minutes of  Exercise per Session: Not on file  Stress:   . Feeling of Stress : Not on file  Social Connections:   . Frequency of Communication with Friends and Family: Not on file  . Frequency of Social Gatherings with Friends and Family: Not on file  . Attends Religious Services: Not on file  . Active Member of Clubs or Organizations: Not on file  . Attends Archivist Meetings: Not on file  . Marital Status: Not on file  Intimate Partner Violence:   . Fear of Current or Ex-Partner: Not on file  . Emotionally Abused: Not on file  . Physically Abused: Not on file  . Sexually Abused: Not on file    Past Surgical History:  Procedure Laterality Date  . CESAREAN SECTION  08/03/2012   Procedure: CESAREAN SECTION;  Surgeon: Jonnie Kind, MD;  Location: [facility];  Service: Obstetrics;  Laterality: N/A;  . COLONOSCOPY    . VULVAR  LESION REMOVAL Left 09/11/2014   Procedure: EXCISION OF LEFT VULVAR ABSCESS WITH COMPLEX REPAIR;  Surgeon: Florian Buff, MD;  Location: [facility];  Service: Gynecology;  Laterality: Left;  . WISDOM TOOTH EXTRACTION          Physical Exam: There were no vitals taken for this visit.    Affect appropriate Healthy:  appears stated age 38: normal Neck supple with no adenopathy JVP normal no bruits no thyromegaly Lungs clear with no wheezing and good diaphragmatic motion Heart:  S1/S2 1/6 SEM  murmur, no rub, gallop or click PMI normal Abdomen: benighn, BS positve, no tenderness, no AAA no bruit.  No HSM or HJR Distal pulses intact with no bruits No edema Neuro non-focal Skin warm and dry No muscular weakness   Labs:   Lab Results  Component Value Date   WBC 10.2 03/05/2016   HGB 13.9 09/07/2019   HCT 41.0 09/07/2019   MCV 89.1 03/05/2016   PLT 305 03/05/2016      Radiology: No results found.  EKG: NSR rate 60 normal    ASSESSMENT AND PLAN:   1. Dizziness: this is non cardiac related. In ER not postural and telemetry with symptoms normal Normal ECG observe 2. Murmur:  Benign sounding have ordered echo as baseline to r/o congenital/ valvular disease   PRN f/u with cardiology   Signed: Jenkins Rouge 10/10/2019, 10:52 AM

## 2019-10-10 ENCOUNTER — Ambulatory Visit (INDEPENDENT_AMBULATORY_CARE_PROVIDER_SITE_OTHER): Payer: BC Managed Care – PPO | Admitting: Cardiovascular Disease

## 2019-10-10 ENCOUNTER — Other Ambulatory Visit: Payer: Self-pay

## 2019-10-10 ENCOUNTER — Encounter: Payer: Self-pay | Admitting: Cardiovascular Disease

## 2019-10-10 VITALS — BP 108/70 | HR 72 | Temp 97.3°F | Ht 59.0 in | Wt 133.0 lb

## 2019-10-10 DIAGNOSIS — R42 Dizziness and giddiness: Secondary | ICD-10-CM | POA: Diagnosis not present

## 2019-10-10 DIAGNOSIS — R011 Cardiac murmur, unspecified: Secondary | ICD-10-CM

## 2019-10-10 NOTE — Patient Instructions (Signed)
Medication Instructions:  Your physician recommends that you continue on your current medications as directed. Please refer to the Current Medication list given to you today.  *If you need a refill on your cardiac medications before your next appointment, please call your pharmacy*  Lab Work: NONE If you have labs (blood work) drawn today and your tests are completely normal, you will receive your results only by: Marland Kitchen MyChart Message (if you have MyChart) OR . A paper copy in the mail If you have any lab test that is abnormal or we need to change your treatment, we will call you to review the results.  Testing/Procedures: Your physician has requested that you have an echocardiogram. Echocardiography is a painless test that uses sound waves to create images of your heart. It provides your doctor with information about the size and shape of your heart and how well your heart's chambers and valves are working. This procedure takes approximately one hour. There are no restrictions for this procedure.    Follow-Up: as needed with Dr.Nishan

## 2019-10-18 ENCOUNTER — Other Ambulatory Visit (HOSPITAL_COMMUNITY): Payer: BC Managed Care – PPO

## 2019-10-29 ENCOUNTER — Ambulatory Visit (HOSPITAL_COMMUNITY): Admission: RE | Admit: 2019-10-29 | Payer: BC Managed Care – PPO | Source: Ambulatory Visit

## 2020-08-17 ENCOUNTER — Emergency Department (HOSPITAL_COMMUNITY)
Admission: EM | Admit: 2020-08-17 | Discharge: 2020-08-18 | Disposition: A | Discharge status: Home / Self Care | Payer: BC Managed Care – PPO | Attending: Emergency Medicine | Admitting: Emergency Medicine

## 2020-08-17 ENCOUNTER — Other Ambulatory Visit: Payer: Self-pay

## 2020-08-17 ENCOUNTER — Encounter (HOSPITAL_COMMUNITY): Payer: Self-pay

## 2020-08-17 ENCOUNTER — Emergency Department (HOSPITAL_COMMUNITY): Payer: BC Managed Care – PPO

## 2020-08-17 DIAGNOSIS — R42 Dizziness and giddiness: Secondary | ICD-10-CM | POA: Insufficient documentation

## 2020-08-17 DIAGNOSIS — M25552 Pain in left hip: Secondary | ICD-10-CM | POA: Insufficient documentation

## 2020-08-17 DIAGNOSIS — M7062 Trochanteric bursitis, left hip: Secondary | ICD-10-CM | POA: Diagnosis not present

## 2020-08-17 DIAGNOSIS — Z87891 Personal history of nicotine dependence: Secondary | ICD-10-CM | POA: Diagnosis not present

## 2020-08-17 DIAGNOSIS — K649 Unspecified hemorrhoids: Secondary | ICD-10-CM | POA: Insufficient documentation

## 2020-08-17 NOTE — ED Provider Notes (Signed)
[facility] EMERGENCY DEPARTMENT Provider Note   CSN: [id number] Arrival date & time: 08/17/20  2149     History Chief Complaint  Patient presents with  . Dizziness    [name] is a 29 y.o. female.  Patient presents with multiple complaints.  Primary complaint is dizziness.  Patient has been noticing episodes of feeling off balanced recently.  She does not feel like she is spinning or the room is spinning around her, feels like she is walking on a boat.  After this occurs she sometimes gets a throbbing headache.  She has had this happen in the past and was told that it might be tension headaches no ringing in the ears or hearing loss.  No vision change.  Symptoms come on randomly and may last as long as an hour and then resolved.  She has not identified anything that causes the symptoms.  Does not seem to be with position changes.  Patient also complaining of pain in the left leg.  She is reports that it started approximately a year ago.  At that time she felt a sharp pain in the lateral aspect of the left hip.  Since then she has been having intermittent episodes of pain in the left leg.  No numbness or tingling in the lower extremities.  No change in bowel or bladder function.  Patient also reports that she thinks she might have hemorrhoids.  She has had intermittent swelling around the anus.  She has become concerned that this might be related to her dizziness as well.        Past Medical History:  Diagnosis Date  . Abnormal Pap smear   . Fatigue 01/09/2015  . HPV (human papilloma virus) infection   . Irritable bowel syndrome (IBS)   . LLQ pain 01/09/2015  . Missed period 01/09/2015  . No pertinent past medical history   . Pregnant   . Vaginal Pap smear, abnormal     Patient Active Problem List   Diagnosis Date Noted  . Fatigue 01/09/2015  . Missed period 01/09/2015  . LLQ pain 01/09/2015  . Monilial vulvovaginitis 01/21/2014  . Boil of vulva 01/18/2014  . Smoker  05/01/2013    Past Surgical History:  Procedure Laterality Date  . CESAREAN SECTION  08/03/2012   Procedure: CESAREAN SECTION;  Surgeon: Tilda Burrow, MD;  Location: [facility];  Service: Obstetrics;  Laterality: N/A;  . COLONOSCOPY    . VULVAR LESION REMOVAL Left 09/11/2014   Procedure: EXCISION OF LEFT VULVAR ABSCESS WITH COMPLEX REPAIR;  Surgeon: Lazaro Arms, MD;  Location: [facility];  Service: Gynecology;  Laterality: Left;  . WISDOM TOOTH EXTRACTION       OB History    Gravida  2   Para  2   Term  2   Preterm  0   AB  0   Living  2     SAB  0   TAB  0   Ectopic  0   Multiple  0   Live Births  2           Family History  Problem Relation Age of Onset  . Miscarriages / [place] Mother   . Heart attack Mother        cardiac arrest with heart attack  . Heart disease Maternal Grandmother   . Cancer Maternal Grandfather        prostate  . Heart disease Maternal Grandfather   . Diabetes Paternal Grandmother   .  Heart murmur Father   . Other Neg Hx     Social History   Tobacco Use  . Smoking status: Former Smoker    Packs/day: 0.25    Years: 7.00    Pack years: 1.75    Types: Cigarettes    Quit date: 02/08/2014    Years since quitting: 6.5  . Smokeless tobacco: Never Used  Vaping Use  . Vaping Use: Some days  Substance Use Topics  . Alcohol use: Yes    Comment: weekly- couple drinks a week   . Drug use: No    Home Medications Prior to Admission medications   Medication Sig Start Date End Date Taking? Authorizing Provider  acetaminophen (TYLENOL) 500 MG tablet Take 500 mg by mouth every 6 (six) hours as needed.    [provider]  hydrocortisone (ANUSOL-HC) 25 MG suppository Place 1 suppository (25 mg total) rectally 2 (two) times daily. For 7 days 08/18/20   Gilda CreasePollina, Charod Slawinski J, MD  ibuprofen (ADVIL,MOTRIN) 200 MG tablet Take 200 mg by mouth as needed.    [provider]    Allergies    Sulfa drugs cross  reactors  Review of Systems   Review of Systems  Musculoskeletal: Positive for arthralgias.  Neurological: Positive for dizziness and headaches.  All other systems reviewed and are negative.   Physical Exam Updated Vital Signs BP (!) 132/93   Pulse (!) 57   Temp 98.3 F (36.8 C) (Oral)   Resp 19   Ht 4\' 11"  (1.499 m)   Wt 55.3 kg   SpO2 100%   BMI 24.64 kg/m   Physical Exam Vitals and nursing note reviewed. Exam conducted with a chaperone present.  Constitutional:      General: She is not in acute distress.    Appearance: Normal appearance. She is well-developed.  HENT:     Head: Normocephalic and atraumatic.     Right Ear: Hearing normal.     Left Ear: Hearing normal.     Nose: Nose normal.  Eyes:     Conjunctiva/sclera: Conjunctivae normal.     Pupils: Pupils are equal, round, and reactive to light.  Cardiovascular:     Rate and Rhythm: Regular rhythm.     Heart sounds: S1 normal and S2 normal. No murmur heard.  No friction rub. No gallop.   Pulmonary:     Effort: Pulmonary effort is normal. No respiratory distress.     Breath sounds: Normal breath sounds.  Chest:     Chest wall: No tenderness.  Abdominal:     General: Bowel sounds are normal.     Palpations: Abdomen is soft.     Tenderness: There is no abdominal tenderness. There is no guarding or rebound. Negative signs include Murphy's sign and McBurney's sign.     Hernia: No hernia is present.  Genitourinary:    Comments: No external hemorrhoids noted Musculoskeletal:        General: Normal range of motion.     Cervical back: Normal range of motion and neck supple.     Lumbar back: Negative right straight leg raise test and negative left straight leg raise test.  Skin:    General: Skin is warm and dry.     Findings: No rash.  Neurological:     Mental Status: She is alert and oriented to person, place, and time.     GCS: GCS eye subscore is 4. GCS verbal subscore is 5. GCS motor subscore is 6.  Cranial Nerves: No cranial nerve deficit.     Sensory: No sensory deficit.     Coordination: Coordination normal.  Psychiatric:        Speech: Speech normal.        Behavior: Behavior normal.        Thought Content: Thought content normal.     ED Results / Procedures / Treatments   Labs (all labs ordered are listed, but only abnormal results are displayed) Labs Reviewed  BASIC METABOLIC PANEL - Abnormal; Notable for the following components:      Result Value   Potassium 3.2 (*)    All other components within normal limits  CBC WITH DIFFERENTIAL/PLATELET  I-STAT BETA HCG BLOOD, ED ([facility], [facility], [facility] ONLY)    EKG EKG Interpretation  Date/Time:  Monday August 18 2020 00:03:03 EST Ventricular Rate:  61 PR Interval:    QRS Duration: 101 QT Interval:  442 QTC Calculation: 446 R Axis:   68 Text Interpretation: Sinus rhythm Normal ECG Confirmed by Gilda Crease [phone]) on 08/18/2020 12:13:37 AM   Radiology CT HEAD WO CONTRAST  Result Date: 08/18/2020 CLINICAL DATA:  Dizziness EXAM: CT HEAD WITHOUT CONTRAST TECHNIQUE: Contiguous axial images were obtained from the base of the skull through the vertex without intravenous contrast. COMPARISON:  None. FINDINGS: Brain: Normal anatomic configuration. No abnormal intra or extra-axial mass lesion or fluid collection. No abnormal mass effect or midline shift. No evidence of acute intracranial hemorrhage or infarct. Ventricular size is normal. Cerebellum unremarkable. Vascular: Unremarkable Skull: Intact Sinuses/Orbits: Paranasal sinuses are clear. Orbits are unremarkable. Other: Mastoid air cells and middle ear cavities are clear. IMPRESSION: Normal head CT.  No acute intracranial abnormality. Electronically Signed   By: Helyn Numbers MD   On: 08/18/2020 00:37    Procedures Procedures (including critical care time)  Medications Ordered in ED Medications - No data to display  ED Course  I have reviewed the triage vital signs and  the nursing notes.  Pertinent labs & imaging results that were available during my care of the patient were reviewed by me and considered in my medical decision making (see chart for details).    MDM Rules/Calculators/A&P                          Patient presents with multiple complaints.  Primary complaint is intermittent episodes of dizziness.  Etiology is unclear.  She has a normal neurologic exam.  Head CT is normal.  Symptoms do not sound like vertigo.  She is not orthostatic.  Patient reassured, I do not feel that she requires any further work-up at this time.  Patient also complaining of left leg pain.  Patient indicates pain in the left lateral hip area intermittently with some radiation to the leg.  Currently her exam is normal.  She does not have low back pain.  No straight leg raise pain, symptoms are not consistent with a radiculopathy.  Symptoms are likely more consistent with a hip bursitis or SI syndrome.  Will give exercise instructions to help.  Use NSAIDs as needed.  External exam does not reveal any hemorrhoids.  She reports that sometimes there is a purple protuberance from the rectum.  This is likely intermittent hemorrhoid, possibly internal.  Will prescribe Anusol HC for use as needed.  Final Clinical Impression(s) / ED Diagnoses Final diagnoses:  Dizziness  Trochanteric bursitis of left hip  Hemorrhoids, unspecified hemorrhoid type    Rx / DC  Orders ED Discharge Orders         Ordered    hydrocortisone (ANUSOL-HC) 25 MG suppository  2 times daily        08/18/20 0150           Gilda Crease, MD 08/18/20 0151

## 2020-08-17 NOTE — ED Notes (Signed)
ED Provider at bedside. 

## 2020-08-17 NOTE — ED Triage Notes (Signed)
Pt to er, pt states that she has been having some episodes of dizziness for the past couple of days, states that the episodes comes and go.  States that the dizziness is worse when she moves side to side, states it feels like she is on a rocking boat, and her head starts pounding.  It is almost as though the dizziness causes the pounding in her head.    Pt states that she also has been having some pain and numbness in her L leg. States that it is a shooting pain from her hip down her leg.  States that she has had this off and on for the past year.    Pt states that she also thinks that she has a hemorrhoid.  States that she has some rectal itching and when she looked she had a small purple mass that appears when she pushes.

## 2020-08-18 DIAGNOSIS — R42 Dizziness and giddiness: Secondary | ICD-10-CM | POA: Diagnosis not present

## 2020-08-18 DIAGNOSIS — K649 Unspecified hemorrhoids: Secondary | ICD-10-CM | POA: Diagnosis not present

## 2020-08-18 DIAGNOSIS — M7062 Trochanteric bursitis, left hip: Secondary | ICD-10-CM | POA: Diagnosis not present

## 2020-08-18 LAB — CBC WITH DIFFERENTIAL/PLATELET
Abs Immature Granulocytes: 0 10*3/uL (ref 0.00–0.07)
Basophils Absolute: 0 10*3/uL (ref 0.0–0.1)
Basophils Relative: 0 %
Eosinophils Absolute: 0.1 10*3/uL (ref 0.0–0.5)
Eosinophils Relative: 2 %
HCT: 39.1 % (ref 36.0–46.0)
Hemoglobin: 13 g/dL (ref 12.0–15.0)
Immature Granulocytes: 0 %
Lymphocytes Relative: 41 %
Lymphs Abs: 2 10*3/uL (ref 0.7–4.0)
MCH: 30.7 pg (ref 26.0–34.0)
MCHC: 33.2 g/dL (ref 30.0–36.0)
MCV: 92.4 fL (ref 80.0–100.0)
Monocytes Absolute: 0.6 10*3/uL (ref 0.1–1.0)
Monocytes Relative: 13 %
Neutro Abs: 2.1 10*3/uL (ref 1.7–7.7)
Neutrophils Relative %: 44 %
Platelets: 278 10*3/uL (ref 150–400)
RBC: 4.23 MIL/uL (ref 3.87–5.11)
RDW: 12.3 % (ref 11.5–15.5)
WBC: 4.8 10*3/uL (ref 4.0–10.5)
nRBC: 0 % (ref 0.0–0.2)

## 2020-08-18 LAB — BASIC METABOLIC PANEL
Anion gap: 9 (ref 5–15)
BUN: 16 mg/dL (ref 6–20)
CO2: 25 mmol/L (ref 22–32)
Calcium: 9.4 mg/dL (ref 8.9–10.3)
Chloride: 101 mmol/L (ref 98–111)
Creatinine, Ser: 0.86 mg/dL (ref 0.44–1.00)
GFR, Estimated: >60 mL/min (ref >60)
Glucose, Bld: 77 mg/dL (ref 70–99)
Potassium: 3.2 mmol/L — ABNORMAL LOW (ref 3.5–5.1)
Sodium: 135 mmol/L (ref 135–145)

## 2020-08-18 LAB — I-STAT BETA HCG BLOOD, ED (MC, WL, AP ONLY): I-stat hCG, quantitative: <5 m[IU]/mL (ref <5)

## 2020-08-18 MED ORDER — HYDROCORTISONE ACETATE 25 MG RE SUPP: 25.0000 mg | Freq: Two times a day (BID) | RECTAL | 0 refills | Status: DC

## 2020-08-21 DIAGNOSIS — R42 Dizziness and giddiness: Secondary | ICD-10-CM | POA: Diagnosis not present

## 2020-08-21 DIAGNOSIS — G44001 Cluster headache syndrome, unspecified, intractable: Secondary | ICD-10-CM | POA: Diagnosis not present

## 2020-08-25 DIAGNOSIS — F411 Generalized anxiety disorder: Secondary | ICD-10-CM | POA: Diagnosis not present

## 2020-08-25 DIAGNOSIS — G44001 Cluster headache syndrome, unspecified, intractable: Secondary | ICD-10-CM | POA: Diagnosis not present

## 2020-08-25 DIAGNOSIS — R42 Dizziness and giddiness: Secondary | ICD-10-CM | POA: Diagnosis not present

## 2020-09-08 DIAGNOSIS — R42 Dizziness and giddiness: Secondary | ICD-10-CM | POA: Diagnosis not present

## 2020-09-08 DIAGNOSIS — G44001 Cluster headache syndrome, unspecified, intractable: Secondary | ICD-10-CM | POA: Diagnosis not present

## 2020-09-08 DIAGNOSIS — F411 Generalized anxiety disorder: Secondary | ICD-10-CM | POA: Diagnosis not present

## 2020-10-13 ENCOUNTER — Other Ambulatory Visit: Payer: BC Managed Care – PPO | Admitting: Adult Health

## 2020-10-25 ENCOUNTER — Encounter (HOSPITAL_COMMUNITY): Payer: Self-pay | Admitting: *Deleted

## 2020-10-25 ENCOUNTER — Other Ambulatory Visit: Payer: Self-pay

## 2020-10-25 ENCOUNTER — Emergency Department (HOSPITAL_COMMUNITY)
Admission: EM | Admit: 2020-10-25 | Discharge: 2020-10-25 | Disposition: A | Discharge status: Home / Self Care | Payer: BC Managed Care – PPO | Attending: Emergency Medicine | Admitting: Emergency Medicine

## 2020-10-25 DIAGNOSIS — Z5321 Procedure and treatment not carried out due to patient leaving prior to being seen by health care provider: Secondary | ICD-10-CM | POA: Diagnosis not present

## 2020-10-25 DIAGNOSIS — R519 Headache, unspecified: Secondary | ICD-10-CM | POA: Insufficient documentation

## 2020-10-25 DIAGNOSIS — R55 Syncope and collapse: Secondary | ICD-10-CM | POA: Insufficient documentation

## 2020-10-25 NOTE — ED Triage Notes (Signed)
Pt c/o head pressure for 4 months.  Pt states she passed out last night.  Pt states she can not function doing daily routines. Pt not able to see neurologist.  Pressure to back of head and with bending neck feels like something is pulling.

## 2020-10-28 ENCOUNTER — Encounter (HOSPITAL_COMMUNITY): Payer: Self-pay | Admitting: Emergency Medicine

## 2020-10-28 ENCOUNTER — Emergency Department (HOSPITAL_COMMUNITY): Payer: BC Managed Care – PPO

## 2020-10-28 ENCOUNTER — Other Ambulatory Visit: Payer: Self-pay

## 2020-10-28 ENCOUNTER — Emergency Department (HOSPITAL_COMMUNITY)
Admission: EM | Admit: 2020-10-28 | Discharge: 2020-10-29 | Disposition: A | Discharge status: Home / Self Care | Payer: BC Managed Care – PPO | Attending: Emergency Medicine | Admitting: Emergency Medicine

## 2020-10-28 DIAGNOSIS — R519 Headache, unspecified: Secondary | ICD-10-CM | POA: Diagnosis not present

## 2020-10-28 DIAGNOSIS — Z87891 Personal history of nicotine dependence: Secondary | ICD-10-CM | POA: Insufficient documentation

## 2020-10-28 DIAGNOSIS — M50223 Other cervical disc displacement at C6-C7 level: Secondary | ICD-10-CM | POA: Diagnosis not present

## 2020-10-28 DIAGNOSIS — M4802 Spinal stenosis, cervical region: Secondary | ICD-10-CM | POA: Diagnosis not present

## 2020-10-28 DIAGNOSIS — M542 Cervicalgia: Secondary | ICD-10-CM | POA: Diagnosis not present

## 2020-10-28 DIAGNOSIS — M50323 Other cervical disc degeneration at C6-C7 level: Secondary | ICD-10-CM | POA: Diagnosis not present

## 2020-10-28 DIAGNOSIS — R42 Dizziness and giddiness: Secondary | ICD-10-CM | POA: Insufficient documentation

## 2020-10-28 LAB — DIFFERENTIAL
Abs Immature Granulocytes: 0.01 10*3/uL (ref 0.00–0.07)
Basophils Absolute: 0 10*3/uL (ref 0.0–0.1)
Basophils Relative: 0 %
Eosinophils Absolute: 0.1 10*3/uL (ref 0.0–0.5)
Eosinophils Relative: 1 %
Immature Granulocytes: 0 %
Lymphocytes Relative: 27 %
Lymphs Abs: 2 10*3/uL (ref 0.7–4.0)
Monocytes Absolute: 0.8 10*3/uL (ref 0.1–1.0)
Monocytes Relative: 11 %
Neutro Abs: 4.4 10*3/uL (ref 1.7–7.7)
Neutrophils Relative %: 61 %

## 2020-10-28 LAB — COMPREHENSIVE METABOLIC PANEL
ALT: 22 U/L (ref 0–44)
AST: 26 U/L (ref 15–41)
Albumin: 3.7 g/dL (ref 3.5–5.0)
Alkaline Phosphatase: 66 U/L (ref 38–126)
Anion gap: 10 (ref 5–15)
BUN: 14 mg/dL (ref 6–20)
CO2: 23 mmol/L (ref 22–32)
Calcium: 9.1 mg/dL (ref 8.9–10.3)
Chloride: 107 mmol/L (ref 98–111)
Creatinine, Ser: 0.89 mg/dL (ref 0.44–1.00)
GFR, Estimated: >60 mL/min (ref >60)
Glucose, Bld: 92 mg/dL (ref 70–99)
Potassium: 4.5 mmol/L (ref 3.5–5.1)
Sodium: 140 mmol/L (ref 135–145)
Total Bilirubin: 0.8 mg/dL (ref 0.3–1.2)
Total Protein: 6.1 g/dL — ABNORMAL LOW (ref 6.5–8.1)

## 2020-10-28 LAB — CBC
HCT: 37.2 % (ref 36.0–46.0)
Hemoglobin: 12.5 g/dL (ref 12.0–15.0)
MCH: 30.6 pg (ref 26.0–34.0)
MCHC: 33.6 g/dL (ref 30.0–36.0)
MCV: 91.2 fL (ref 80.0–100.0)
Platelets: 310 10*3/uL (ref 150–400)
RBC: 4.08 MIL/uL (ref 3.87–5.11)
RDW: 11.6 % (ref 11.5–15.5)
WBC: 7.2 10*3/uL (ref 4.0–10.5)
nRBC: 0 % (ref 0.0–0.2)

## 2020-10-28 LAB — I-STAT CHEM 8, ED
BUN: 16 mg/dL (ref 6–20)
Calcium, Ion: 1.02 mmol/L — ABNORMAL LOW (ref 1.15–1.40)
Chloride: 108 mmol/L (ref 98–111)
Creatinine, Ser: 0.8 mg/dL (ref 0.44–1.00)
Glucose, Bld: 91 mg/dL (ref 70–99)
HCT: 36 % (ref 36.0–46.0)
Hemoglobin: 12.2 g/dL (ref 12.0–15.0)
Potassium: 4.3 mmol/L (ref 3.5–5.1)
Sodium: 139 mmol/L (ref 135–145)
TCO2: 23 mmol/L (ref 22–32)

## 2020-10-28 LAB — PROTIME-INR
INR: 1 (ref 0.8–1.2)
Prothrombin Time: 12.3 seconds (ref 11.4–15.2)

## 2020-10-28 LAB — I-STAT BETA HCG BLOOD, ED (MC, WL, AP ONLY): I-stat hCG, quantitative: <5 m[IU]/mL (ref <5)

## 2020-10-28 LAB — APTT: aPTT: 26 seconds (ref 24–36)

## 2020-10-28 MED ORDER — SODIUM CHLORIDE 0.9% FLUSH
3.0000 mL | Freq: Once | INTRAVENOUS | Status: DC
Start: 2020-10-28 — End: 2020-10-29

## 2020-10-28 NOTE — ED Triage Notes (Signed)
Pt states she has been having constant pressure in the back of her head. Pt complains of a "pinch" in her upper back. Pt also complains of pain/tightness in right jaw. Pt states she has passed out 3 times in the past week. Pt states she has been trying to get an MRI since November.

## 2020-10-29 ENCOUNTER — Emergency Department (HOSPITAL_COMMUNITY): Payer: BC Managed Care – PPO

## 2020-10-29 DIAGNOSIS — M50223 Other cervical disc displacement at C6-C7 level: Secondary | ICD-10-CM | POA: Diagnosis not present

## 2020-10-29 DIAGNOSIS — M50323 Other cervical disc degeneration at C6-C7 level: Secondary | ICD-10-CM | POA: Diagnosis not present

## 2020-10-29 DIAGNOSIS — M4802 Spinal stenosis, cervical region: Secondary | ICD-10-CM | POA: Diagnosis not present

## 2020-10-29 DIAGNOSIS — R42 Dizziness and giddiness: Secondary | ICD-10-CM | POA: Diagnosis not present

## 2020-10-29 MED ORDER — MECLIZINE HCL 25 MG PO TABS: 25.0000 mg | ORAL_TABLET | Freq: Three times a day (TID) | ORAL | 0 refills | Status: DC | PRN

## 2020-10-29 NOTE — Discharge Instructions (Signed)
Begin taking meclizine as prescribed as needed for dizziness.  Follow-up with neurology as soon as possible.  The contact information for Dr. Gerilyn Pilgrim has been provided in this discharge summary for you to call and make these arrangements if your already scheduled appointment is unable to be expedited.  Return to the ER if your symptoms significantly worsen or change.

## 2020-10-29 NOTE — ED Provider Notes (Signed)
[facility] EMERGENCY DEPARTMENT Provider Note   CSN: [id number] Arrival date & time: 10/28/20  1639     History No chief complaint on file.   [name] is a 30 y.o. female.  Patient is a 30 year old female with past medical history of prior C-section.  She presents today for evaluation of dizziness, occipital headache, and neck pain.  This has been worsening over the course of the past 2-1/2 months.  She describes a throbbing in the back of her head that comes and goes and associated instability with her balance.  She states that it makes it impossible for her to get through her daily activities as the symptoms occur frequently.  She denies any numbness in her arms or legs.  She denies any visual disturbances.  Patient has had CT scans performed, however no cause has been found.  She has been referred to neurology by her primary doctor, however this appointment is not until 15 February.  Patient states that she cannot wait any longer.  She was told by her primary doctor to come to the ER for an MRI.  The history is provided by the patient.       Past Medical History:  Diagnosis Date  . Abnormal Pap smear   . Fatigue 01/09/2015  . HPV (human papilloma virus) infection   . Irritable bowel syndrome (IBS)   . LLQ pain 01/09/2015  . Missed period 01/09/2015  . No pertinent past medical history   . Pregnant   . Vaginal Pap smear, abnormal     Patient Active Problem List   Diagnosis Date Noted  . Fatigue 01/09/2015  . Missed period 01/09/2015  . LLQ pain 01/09/2015  . Monilial vulvovaginitis 01/21/2014  . Boil of vulva 01/18/2014  . Smoker 05/01/2013    Past Surgical History:  Procedure Laterality Date  . CESAREAN SECTION  08/03/2012   Procedure: CESAREAN SECTION;  Surgeon: Tilda Burrow, MD;  Location: [facility];  Service: Obstetrics;  Laterality: N/A;  . COLONOSCOPY    . VULVAR LESION REMOVAL Left 09/11/2014   Procedure: EXCISION OF LEFT VULVAR ABSCESS WITH  COMPLEX REPAIR;  Surgeon: Lazaro Arms, MD;  Location: [facility];  Service: Gynecology;  Laterality: Left;  . WISDOM TOOTH EXTRACTION       OB History    Gravida  2   Para  2   Term  2   Preterm  0   AB  0   Living  2     SAB  0   IAB  0   Ectopic  0   Multiple  0   Live Births  2           Family History  Problem Relation Age of Onset  . Miscarriages / [place] Mother   . Heart attack Mother        cardiac arrest with heart attack  . Heart disease Maternal Grandmother   . Cancer Maternal Grandfather        prostate  . Heart disease Maternal Grandfather   . Diabetes Paternal Grandmother   . Heart murmur Father   . Other Neg Hx     Social History   Tobacco Use  . Smoking status: Former Smoker    Packs/day: 0.25    Years: 7.00    Pack years: 1.75    Types: Cigarettes    Quit date: 02/08/2014    Years since quitting: 6.7  . Smokeless tobacco: Never Used  Vaping  Use  . Vaping Use: Some days  Substance Use Topics  . Alcohol use: Yes    Comment: weekly- couple drinks a week   . Drug use: No    Home Medications Prior to Admission medications   Medication Sig Start Date End Date Taking? Authorizing Provider  acetaminophen (TYLENOL) 500 MG tablet Take 500 mg by mouth every 6 (six) hours as needed.    [provider]  hydrocortisone (ANUSOL-HC) 25 MG suppository Place 1 suppository (25 mg total) rectally 2 (two) times daily. For 7 days 08/18/20   Gilda Crease, MD  ibuprofen (ADVIL,MOTRIN) 200 MG tablet Take 200 mg by mouth as needed.    [provider]    Allergies    Sulfa drugs cross reactors  Review of Systems   Review of Systems  All other systems reviewed and are negative.   Physical Exam Updated Vital Signs BP 110/73 (BP Location: Left Arm)   Pulse 67   Temp 98.3 F (36.8 C) (Oral)   Resp 14   LMP 10/19/2020   SpO2 100%   Physical Exam Vitals and nursing note reviewed.  Constitutional:      General:  She is not in acute distress.    Appearance: She is well-developed and well-nourished. She is not diaphoretic.  HENT:     Head: Normocephalic and atraumatic.  Eyes:     Extraocular Movements: Extraocular movements intact.     Pupils: Pupils are equal, round, and reactive to light.  Cardiovascular:     Rate and Rhythm: Normal rate and regular rhythm.     Heart sounds: No murmur heard. No friction rub. No gallop.   Pulmonary:     Effort: Pulmonary effort is normal. No respiratory distress.     Breath sounds: Normal breath sounds. No wheezing.  Abdominal:     General: Bowel sounds are normal. There is no distension.     Palpations: Abdomen is soft.     Tenderness: There is no abdominal tenderness.  Musculoskeletal:        General: Normal range of motion.     Cervical back: Normal range of motion and neck supple.  Skin:    General: Skin is warm and dry.  Neurological:     General: No focal deficit present.     Mental Status: She is alert and oriented to person, place, and time.     Cranial Nerves: No cranial nerve deficit.     Motor: No weakness.     Coordination: Coordination normal.     ED Results / Procedures / Treatments   Labs (all labs ordered are listed, but only abnormal results are displayed) Labs Reviewed  COMPREHENSIVE METABOLIC PANEL - Abnormal; Notable for the following components:      Result Value   Total Protein 6.1 (*)    All other components within normal limits  I-STAT CHEM 8, ED - Abnormal; Notable for the following components:   Calcium, Ion 1.02 (*)    All other components within normal limits  PROTIME-INR  APTT  CBC  DIFFERENTIAL  I-STAT BETA HCG BLOOD, ED ([facility], [facility], [facility] ONLY)  CBG MONITORING, ED    EKG None  Radiology CT HEAD WO CONTRAST  Result Date: 10/28/2020 CLINICAL DATA:  Headache EXAM: CT HEAD WITHOUT CONTRAST TECHNIQUE: Contiguous axial images were obtained from the base of the skull through the vertex without intravenous contrast.  COMPARISON:  08/18/2020 FINDINGS: Brain: No acute intracranial abnormality. Specifically, no hemorrhage, hydrocephalus, mass lesion, acute infarction,  or significant intracranial injury. Vascular: No hyperdense vessel or unexpected calcification. Skull: No acute calvarial abnormality. Sinuses/Orbits: Air-fluid level in the visualized left maxillary sinus. Other: None IMPRESSION: No acute intracranial abnormality. Acute left maxillary sinusitis. Electronically Signed   By: Charlett Nose M.D.   On: 10/28/2020 17:40    Procedures Procedures   Medications Ordered in ED Medications  sodium chloride flush (NS) 0.9 % injection 3 mL (has no administration in time range)    ED Course  I have reviewed the triage vital signs and the nursing notes.  Pertinent labs & imaging results that were available during my care of the patient were reviewed by me and considered in my medical decision making (see chart for details).    MDM Rules/Calculators/A&P  Patient is a 30 year old female presenting with complaints of pain in the back of her head and feeling off balance.  This has been ongoing since November.  She tells me she has passed out several times and these episodes are getting to the point where she "cannot function".  Patient is neurologically intact here and her MRI of the brain and cervical spine are essentially unremarkable.  She does have sinusitis of the maxillary sinus and mild degenerative changes at C6-C7, but nothing that would explain her symptoms.  At this point, I have been unable to identify a cause for her symptoms or emergent pathology.  At this point, I feel as though patient can safely be discharged.  I will prescribe meclizine in case this is some sort of vertigo and see if this helps.  I have advised her to follow-up with neurology.  She has an appointment on February 15 I believe that [facility] neurology.  I have also told her that there is a neurologist where she lives in [place]  with whom she could also follow-up.  Final Clinical Impression(s) / ED Diagnoses Final diagnoses:  None    Rx / DC Orders ED Discharge Orders    None       Geoffery Lyons, MD 10/29/20 [phone]

## 2020-11-01 DIAGNOSIS — J3489 Other specified disorders of nose and nasal sinuses: Secondary | ICD-10-CM | POA: Diagnosis not present

## 2020-11-01 DIAGNOSIS — R519 Headache, unspecified: Secondary | ICD-10-CM | POA: Diagnosis not present

## 2020-11-01 DIAGNOSIS — J019 Acute sinusitis, unspecified: Secondary | ICD-10-CM | POA: Diagnosis not present

## 2020-11-16 NOTE — Progress Notes (Signed)
[id number]    Provider:  Dr Lucia Gaskins Requesting Provider: Benita Stabile, MD Primary Care Provider:  Benita Stabile, MD  CC:  headaches  HPI:  [name] is a 30 y.o. female here as requested by Benita Stabile, MD for headaches.  PMHx smoker, headaches and dizziness, anxiety on Prozac.She has never been diagnosed with migraines. In November she was sitting in the stands and felt dizzy, she took hot shower and saw "stars", off balance like on a boat, tingling in her pinky and ring finer mostly, she has a pinch feeling, tightness in the neck, with radiation down the shoulder, her tongue gets tingly she was on antibiotics and now she has a feeling in her throat. She took a bath and everything went blank and she went to the ER, a week before Jan 25th her husband had tested positive for covid she had a sinus infection at that time on the 25th, the left toe fell asleep once, she has some occ numbness in the anterior thighs, she has pain on her scalp, she has never had it before, would hurt to touch her head in the back, her jew is tight. She still has jaw tightness, her 4th-5th digits feel numb, upper teeth feel tight, her mouth doesn't want to close and she clenches. Back of the head still doesn't feel normal, tightness in the neck. No other focal neurologic deficits, associated symptoms, inciting events or modifiable factors.  Reviewed notes, labs and imaging from outside physicians, which showed:   MRI of the brain: Personally reviewed images and agree with the following Brain: Normal cerebral volume. No restricted diffusion to suggest acute infarction. No midline shift, mass effect, evidence of mass lesion, ventriculomegaly, extra-axial collection or acute intracranial hemorrhage. Cervicomedullary junction and pituitary are within normal limits.  Wallace Cullens and white matter signal is within normal limits throughout the brain. No encephalomalacia or chronic cerebral blood  products identified. Deep gray nuclei, brainstem and cerebellum are within normal limits.  Vascular: Major intracranial vascular flow voids are preserved.  Skull and upper cervical spine: Cervical spine detailed separately today. Visible marrow signal is within normal limits for age.  Sinuses/Orbits: Negative orbits. Fluid level and mucosal thickening redemonstrated in the left maxillary sinus, with mild left ethmoid and sphenoid mucosal thickening.  Other paranasal sinuses are clear.  Other: Mastoid air cells are clear. Grossly normal visible internal auditory structures. Normal stylomastoid foramina. Visible scalp and face appear negative.  IMPRESSION: 1. Normal noncontrast MRI appearance of the brain. 2. Evidence of acute left maxillary sinusitis.   MRI of the cervical spine October 29, 2020 reviewed report:  Isolated cervical disc degeneration at C6-C7 with mild spinal stenosis and up to moderate right C7 neural foraminal stenosis. No spinal cord mass effect or signal abnormality. October 28, 2020: CMP unremarkable August 18, 2020: BMP and CBC unremarkable   I reviewed Dr. Scharlene Gloss notes: Patient presented to the office with complaints of head fullness for 1-1/2 weeks, random shooting pains in different parts of the body and dizziness, medications used were Imitrex nasal 5 mg, vital signs were normal, which I reviewed Dr. 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 Wyline MoodBranch with cardiology, also getting ocular symptoms.   Review of Systems: Patient complains of symptoms per HPI as well as the following symptoms; headache. Pertinent negatives and positives per HPI. All others negative.   Social History   Socioeconomic History  . Marital status: Married    Spouse name: Not on file  . Number of children: 2  . Years of education: Not on file  . Highest education level: GED or equivalent  Occupational History  . Not on file  Tobacco Use  . Smoking status: Former Smoker    Packs/day: 0.25    Years: 7.00    Pack years: 1.75    Types: Cigarettes    Quit date: 02/08/2014    Years since quitting: 6.7  . Smokeless tobacco: Never Used  Vaping Use  . Vaping Use: Some days  . Substances: THC  Substance and Sexual Activity  . Alcohol use: Yes    Alcohol/week: 12.0 standard drinks    Types: 12 Standard drinks or equivalent per week    Comment: "if I drink that week"  . Drug use: Yes    Comment: THC vape occasionally for desperate relief for pain  . Sexual activity: Yes    Birth control/protection: Surgical    Comment: husband had vasectomy  Other Topics Concern  . 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 Miscarriages / [place] Mother   . Heart attack Mother        cardiac arrest with heart attack  . Heart failure Mother   . Heart disease Maternal Grandmother   . Cancer Maternal Grandfather        prostate  . Heart disease Maternal Grandfather   . Diabetes Paternal Grandmother   . Heart murmur Father    . Other Neg Hx     Past Medical History:  Diagnosis Date  . Abnormal Pap smear   . Disc degeneration, lumbar    thoracic, and cervical per pt report  . Fatigue 01/09/2015  . Heart murmur   . HPV (human papilloma virus) infection   . Irritable bowel syndrome (IBS)   . LLQ pain 01/09/2015  . Missed period 01/09/2015  . No pertinent past medical history   . Pregnant   . Psoriasis   . Vaginal Pap smear, abnormal     Patient Active Problem List   Diagnosis Date Noted  . Fatigue 01/09/2015  . Missed period 01/09/2015  . LLQ pain 01/09/2015  . Monilial vulvovaginitis 01/21/2014  . Boil of vulva 01/18/2014  . Smoker 05/01/2013    Past Surgical History:  Procedure Laterality Date  . CESAREAN SECTION  08/03/2012   Procedure: CESAREAN SECTION;  Surgeon: Tilda BurrowJohn V Ferguson, MD;  Location: [facility];  Service: Obstetrics;  Laterality: N/A;  . COLONOSCOPY    . VULVAR LESION REMOVAL Left 09/11/2014   Procedure: EXCISION OF LEFT VULVAR ABSCESS WITH COMPLEX REPAIR;  Surgeon: Lazaro Arms, MD;  Location: [facility];  Service: Gynecology;  Laterality: Left;  . WISDOM TOOTH EXTRACTION      Current Outpatient Medications  Medication Sig Dispense Refill  . acetaminophen (TYLENOL) 500 MG tablet Take 1,000 mg by mouth as needed.    . cyclobenzaprine (FLEXERIL) 10 MG tablet Take 1 tablet (10 mg total) by mouth at bedtime. 30 tablet 3  . methylPREDNISolone (MEDROL DOSEPAK) 4 MG TBPK tablet Take pills daily all together with food for 6 days. 6-5-4-3-2-1 21 tablet 1   No current facility-administered medications for this visit.    Allergies as of 11/17/2020 - Review Complete 11/17/2020  Allergen Reaction Noted  . Other  11/17/2020  . Sulfa drugs cross reactors Hives 12/05/2011    Vitals: BP 109/77 (BP Location: Left Arm, Patient Position: Sitting)   Pulse 71   Ht 4\' 11"  (1.499 m)   Wt 128 lb (58.1 kg)   LMP 10/19/2020   BMI 25.85 kg/m  Last Weight:  Wt Readings from Last 1 Encounters:   11/17/20 128 lb (58.1 kg)   Last Height:   Ht Readings from Last 1 Encounters:  11/17/20 4\' 11"  (1.499 m)     Physical exam: Exam: Gen: NAD, conversant, well nourised, well groomed                     CV: RRR, no MRG. No Carotid Bruits. No peripheral edema, warm, nontender Eyes: Conjunctivae clear without exudates or hemorrhage  Neuro: Detailed Neurologic Exam  Speech:    Speech is normal; fluent and spontaneous with normal comprehension.  Cognition:    The patient is oriented to person, place, and time;     recent and remote memory intact;     language fluent;     normal attention, concentration,     fund of knowledge Cranial Nerves:    The pupils are equal, round, and reactive to light. The fundi are normal and spontaneous venous pulsations are present. Visual fields are full to finger confrontation. Extraocular movements are intact. Trigeminal sensation is intact and the muscles of mastication are normal. The face is symmetric. The palate elevates in the midline. Hearing intact. Voice is normal. Shoulder shrug is normal. The tongue has normal motion without fasciculations.   Coordination:    Normal finger to nose   Gait:    Normal native gait  Motor Observation:    No asymmetry, no atrophy, and no involuntary movements noted. Tone:    Normal muscle tone.    Posture:    Posture is normal. normal erect    Strength:    Strength is V/V in the upper and lower limbs.      Sensation: intact to LT     Reflex Exam:  DTR's:    Deep tendon reflexes in the upper and lower extremities are normal bilaterally.   Toes:    The toes are downgoing bilaterally.   Clonus:    Clonus is absent.    Assessment/Plan:  30 y.o. female here as requested by , MD for headaches.  PMHx smoker, headaches and dizziness, anxiety.Patient has a constellation of symptoms including neck pain, radicular pain on the right, finger numbness, occipital tenderness and occipital headache,  dizziness (resolved, started back in November), once her toe went numb and tingling in the thighs. MRI brain normal. MRI cervical spine with c5/c6 degen changes.   -  I think she has very tight cervical muscles which may be causing  occipital neuralgia, she does have degenerative changes in the spine and a possible right C7 nerve root impingement and/or concomitant median or ulnar neuropathy.  Very difficult to follow all the different symptoms,   -  we will perform an EMG nerve conduction study on the right arm to further evaluate. -  I also discussed occipital neuralgia and recommended stretching, heat, physical therapy or dry needling. - May be a component of cervical radiculopathy, will see what the emg/ncs shows. May consider ESI. - medrol dosepak for 6 days as well as flexeril at bedtime for neck pain and TMJ/bruxism   Orders Placed This Encounter  Procedures  . TSH  . NCV with EMG(electromyography)   Meds ordered this encounter  Medications  . cyclobenzaprine (FLEXERIL) 10 MG tablet    Sig: Take 1 tablet (10 mg total) by mouth at bedtime.    Dispense:  30 tablet    Refill:  3  . methylPREDNISolone (MEDROL DOSEPAK) 4 MG TBPK tablet    Sig: Take pills daily all together with food for 6 days. 6-5-4-3-2-1    Dispense:  21 tablet    Refill:  1    Cc: Benita Stabile, MD,  Benita Stabile, MD  Naomie Dean, MD  [facility] [address] [place], [place] [address]  Phone [phone] Fax [phone]

## 2020-11-17 ENCOUNTER — Encounter: Payer: Self-pay | Admitting: Neurology

## 2020-11-17 ENCOUNTER — Ambulatory Visit (INDEPENDENT_AMBULATORY_CARE_PROVIDER_SITE_OTHER): Payer: BC Managed Care – PPO | Admitting: Neurology

## 2020-11-17 ENCOUNTER — Encounter: Payer: Self-pay | Admitting: *Deleted

## 2020-11-17 VITALS — BP 109/77 | HR 71 | Ht 59.0 in | Wt 128.0 lb

## 2020-11-17 DIAGNOSIS — R200 Anesthesia of skin: Secondary | ICD-10-CM

## 2020-11-17 DIAGNOSIS — R5383 Other fatigue: Secondary | ICD-10-CM | POA: Diagnosis not present

## 2020-11-17 DIAGNOSIS — R202 Paresthesia of skin: Secondary | ICD-10-CM

## 2020-11-17 MED ORDER — METHYLPREDNISOLONE 4 MG PO TBPK: ORAL_TABLET | ORAL | 1 refills | Status: DC

## 2020-11-17 MED ORDER — CYCLOBENZAPRINE HCL 10 MG PO TABS: 10.0000 mg | ORAL_TABLET | Freq: Every day | ORAL | 3 refills | Status: DC

## 2020-11-17 NOTE — Patient Instructions (Addendum)
EMG/NCS   Electromyoneurogram Electromyoneurogram is a test to check how well your muscles and nerves are working. This procedure includes the combined use of electromyogram (EMG) and nerve conduction study (NCS). EMG is used to look for muscular disorders. NCS, which is also called electroneurogram, measures how well your nerves are controlling your muscles. The procedures are usually done together to check if your muscles and nerves are healthy. If the results of the tests are abnormal, this may indicate disease or injury, such as a neuromuscular disease or peripheral nerve damage. Tell a health care provider about:  Any allergies you have.  All medicines you are taking, including vitamins, herbs, eye drops, creams, and over-the-counter medicines.  Any problems you or family members have had with anesthetic medicines.  Any blood disorders you have.  Any surgeries you have had.  Any medical conditions you have.  If you have a pacemaker.  Whether you are pregnant or may be pregnant. What are the risks? Generally, this is a safe procedure. However, problems may occur, including:  Infection where the electrodes were inserted.  Bleeding. What happens before the procedure? Medicines Ask your health care provider about:  Changing or stopping your regular medicines. This is especially important if you are taking diabetes medicines or blood thinners.  Taking medicines such as aspirin and ibuprofen. These medicines can thin your blood. Do not take these medicines unless your health care provider tells you to take them.  Taking over-the-counter medicines, vitamins, herbs, and supplements. General instructions  Your health care provider may ask you to avoid: ? Beverages that have caffeine, such as coffee and tea. ? Any products that contain nicotine or tobacco. These products include cigarettes, e-cigarettes, and chewing tobacco. If you need help quitting, ask your health care  provider.  Do not use lotions or creams on the same day that you will be having the procedure. What happens during the procedure? For EMG  Your health care provider will ask you to stay in a position so that he or she can access the muscle that will be studied. You may be standing, sitting, or lying down.  You may be given a medicine that numbs the area (local anesthetic).  A very thin needle that has an electrode will be inserted into your muscle.  Another small electrode will be placed on your skin near the muscle.  Your health care provider will ask you to continue to remain still.  The electrodes will send a signal that tells about the electrical activity of your muscles. You may see this on a monitor or hear it in the room.  After your muscles have been studied at rest, your health care provider will ask you to contract or flex your muscles. The electrodes will send a signal that tells about the electrical activity of your muscles.  Your health care provider will remove the electrodes and the electrode needles when the procedure is finished. The procedure may vary among health care providers and hospitals.   For NCS  An electrode that records your nerve activity (recording electrode) will be placed on your skin by the muscle that is being studied.  An electrode that is used as a reference (reference electrode) will be placed near the recording electrode.  A paste or gel will be applied to your skin between the recording electrode and the reference electrode.  Your nerve will be stimulated with a mild shock. Your health care provider will measure how much time it takes for your  muscle to react.  Your health care provider will remove the electrodes and the gel when the procedure is finished. The procedure may vary among health care providers and hospitals.   What happens after the procedure?  It is up to you to get the results of your procedure. Ask your health care provider,  or the department that is doing the procedure, when your results will be ready.  Your health care provider may: ? Give you medicines for any pain. ? Monitor the insertion sites to make sure that bleeding stops. Summary  Electromyoneurogram is a test to check how well your muscles and nerves are working.  If the results of the tests are abnormal, this may indicate disease or injury.  This is a safe procedure. However, problems may occur, such as bleeding and infection.  Your health care provider will do two tests to complete this procedure. One checks your muscles (EMG) and another checks your nerves (NCS).  It is up to you to get the results of your procedure. Ask your health care provider, or the department that is doing the procedure, when your results will be ready. This information is not intended to replace advice given to you by your health care provider. Make sure you discuss any questions you have with your health care provider. Document Revised: 06/06/2018 Document Reviewed: 05/19/2018 Elsevier Patient Education  2021 [employer]     Cubital Tunnel Syndrome  Cubital tunnel syndrome is a condition that causes pain and weakness of the forearm and hand. It happens when one of the nerves that runs along the inside of the elbow joint (ulnar nerve) becomes irritated. This condition is usually caused by repeated arm motions that are done during sports or work-related activities. What are the causes? This condition may be caused by:  Increased pressure on the ulnar nerve at the elbow, arm, or forearm. This can result from: ? Irritation caused by repeated elbow bending. ? Poorly healed elbow fractures. ? Tumors in the elbow. These are usually noncancerous (benign). ? Scar tissue that develops in the elbow after an injury. ? Bony growths (spurs) near the ulnar nerve.  Stretching of the nerve due to loose elbow ligaments.  Trauma to the nerve at the elbow. What increases the  risk? The following factors may make you more likely to develop this condition:  Doing manual labor that requires frequent bending of the elbow.  Playing sports that include repeated or strenuous throwing motions, such as baseball.  Playing contact sports, such as football or lacrosse.  Not warming up properly before activities.  Having diabetes.  Having an underactive thyroid (hypothyroidism). What are the signs or symptoms? Symptoms of this condition include:  Clumsiness or weakness of the hand.  Tenderness of the inner elbow.  Aching or soreness of the inner elbow, forearm, or fingers, especially the little finger or the ring finger.  Increased pain when forcing the elbow to bend.  Reduced control when throwing objects.  Tingling, numbness, or a burning feeling inside the forearm or in part of the hand or fingers, especially the little finger or the ring finger.  Sharp pains that shoot from the elbow down to the wrist and hand.  The inability to grip or pinch hard. How is this diagnosed? This condition is diagnosed based on:  Your symptoms and medical history. Your health care provider will also ask for details about any injury.  A physical exam. You may also have tests, including:  Electromyogram (EMG). This test  measures electrical signals sent by your nerves into the muscles.  Nerve conduction study. This test measures how well electrical signals pass through your nerves.  Imaging tests, such as X-rays, ultrasound, and MRI. These tests check for possible causes of your condition. How is this treated? This condition may be treated by:  Stopping the activities that are causing your symptoms to get worse.  Icing and taking medicines to reduce pain and swelling.  Wearing a splint to prevent your elbow from bending, or wearing an elbow pad where the ulnar nerve is closest to the skin.  Working with a physical therapist in less severe cases. This may help  to: ? Decrease your symptoms. ? Improve the strength and range of motion of your elbow, forearm, and hand. If these treatments do not help, surgery may be needed. Follow these instructions at home: If you have a splint:  Wear the splint as told by your health care provider. Remove it only as told by your health care provider.  Loosen the splint if your fingers tingle, become numb, or turn cold and blue.  Keep the splint clean.  If the splint is not waterproof: ? Do not let it get wet. ? Cover it with a watertight covering when you take a bath or shower. Managing pain, stiffness, and swelling  If directed, put ice on the injured area: ? Put ice in a plastic bag. ? Place a towel between your skin and the bag. ? Leave the ice on for 20 minutes, 2-3 times a day.  Move your fingers often to avoid stiffness and to lessen swelling.  Raise (elevate) the injured area above the level of your heart while you are sitting or lying down.   General instructions  Take over-the-counter and prescription medicines only as told by your health care provider.  Do any exercise or physical therapy as told by your health care provider.  Do not drive or use heavy machinery while taking prescription pain medicine.  If you were given an elbow pad, wear it as told by your health care provider.  Keep all follow-up visits as told by your health care provider. This is important. Contact a health care provider if:  Your symptoms get worse.  Your symptoms do not get better with treatment.  You have new pain.  Your hand on the injured side feels numb or cold. Summary  Cubital tunnel syndrome is a condition that causes pain and weakness of the forearm and hand.  You are more likely to develop this condition if you do work or play sports that involve repeated arm movements.  This condition is often treated by stopping repetitive activities, applying ice, and using anti-inflammatory medicines.  In  rare cases, surgery may be needed. This information is not intended to replace advice given to you by your health care provider. Make sure you discuss any questions you have with your health care provider. Document Revised: 02/06/2018 Document Reviewed: 02/06/2018 Elsevier Patient Education  2021 [employer]  Cervical Radiculopathy  Cervical radiculopathy means that a nerve in the neck (a cervical nerve) is pinched or bruised. This can happen because of an injury to the cervical spine (vertebrae) in the neck, or as a normal part of getting older. This can cause pain or loss of feeling (numbness) that runs from your neck all the way down to your arm and fingers. Often, this condition gets better with rest. Treatment may be needed if the condition does not get better. What are  the causes?  A neck injury.  A bulging disk in your spine.  Muscle movements that you cannot control (muscle spasms).  Tight muscles in your neck due to overuse.  Arthritis.  Breakdown in the bones and joints of the spine (spondylosis) due to getting older.  Bone spurs that form near the nerves in the neck. What are the signs or symptoms?  Pain. The pain may: ? Run from the neck to the arm and hand. ? Be very bad or irritating. ? Be worse when you move your neck.  Loss of feeling or tingling in your arm or hand.  Weakness in your arm or hand, in very bad cases. How is this treated? In many cases, treatment is not needed for this condition. With rest, the condition often gets better over time. If treatment is needed, options may include:  Wearing a soft neck collar (cervical collar) for short periods of time, as told by your doctor.  Doing exercises (physical therapy) to strengthen your neck muscles.  Taking medicines.  Having shots (injections) in your spine, in very bad cases.  Having surgery. This may be needed if other treatments do not help. The type of surgery that is used depends on the cause  of your condition. Follow these instructions at home: If you have a soft neck collar:  Wear it as told by your doctor. Remove it only as told by your doctor.  Ask your doctor if you can remove the collar for cleaning and bathing. If you are allowed to remove the collar for cleaning or bathing: ? Follow instructions from your doctor about how to remove the collar safely. ? Clean the collar by wiping it with mild soap and water and drying it completely. ? Take out any removable pads in the collar every 1-2 days. Wash them by hand with soap and water. Let them air-dry completely before you put them back in the collar. ? Check your skin under the collar for redness or sores. If you see any, tell your doctor. Managing pain  Take over-the-counter and prescription medicines only as told by your doctor.  If told, put ice on the painful area. ? If you have a soft neck collar, remove it as told by your doctor. ? Put ice in a plastic bag. ? Place a towel between your skin and the bag. ? Leave the ice on for 20 minutes, 2-3 times a day.  If using ice does not help, you can try using heat. Use the heat source that your doctor recommends, such as a moist heat pack or a heating pad. ? Place a towel between your skin and the heat source. ? Leave the heat on for 20-30 minutes. ? Remove the heat if your skin turns bright red. This is very important if you are unable to feel pain, heat, or cold. You may have a greater risk of getting burned.  You may try a gentle neck and shoulder rub (massage).      Activity  Rest as needed.  Return to your normal activities as told by your doctor. Ask your doctor what activities are safe for you.  Do exercises as told by your doctor or physical therapist.  Do not lift anything that is heavier than 10 lb (4.5 kg) until your doctor tells you that it is safe. General instructions  Use a flat pillow when you sleep.  Do not drive while wearing a soft neck collar.  If you do not have a soft  neck collar, ask your doctor if it is safe to drive while your neck heals.  Ask your doctor if the medicine prescribed to you requires you to avoid driving or using heavy machinery.  Do not use any products that contain nicotine or tobacco, such as cigarettes, e-cigarettes, and chewing tobacco. These can delay healing. If you need help quitting, ask your doctor.  Keep all follow-up visits as told by your doctor. This is important. Contact a doctor if:  Your condition does not get better with treatment. Get help right away if:  Your pain gets worse and is not helped with medicine.  You lose feeling or feel weak in your hand, arm, face, or leg.  You have a high fever.  You have a stiff neck.  You cannot control when you poop or pee (have incontinence).  You have trouble with walking, balance, or talking. Summary  Cervical radiculopathy means that a nerve in the neck is pinched or bruised.  A nerve can get pinched from a bulging disk, arthritis, an injury to the neck, or other causes.  Symptoms include pain, tingling, or loss of feeling that goes from the neck into the arm or hand.  Weakness in your arm or hand can happen in very bad cases.  Treatment may include resting, wearing a soft neck collar, and doing exercises. You might need to take medicines for pain. In very bad cases, shots or surgery may be needed. This information is not intended to replace advice given to you by your health care provider. Make sure you discuss any questions you have with your health care provider. Document Revised: 08/11/2018 Document Reviewed: 08/11/2018 Elsevier Patient Education  2021 [employer]  Occipital Neuralgia  Occipital neuralgia is a type of headache that causes brief episodes of very bad pain in the back of the head. Pain from occipital neuralgia may spread (radiate) to other parts of the head. These headaches may be caused by irritation of the nerves  that leave the spinal cord high up in the neck, just below the base of the skull (occipital nerves). The occipital nerves transmit sensations from the back of the head, the top of the head, and the areas behind the ears. What are the causes? This condition can occur without any known cause (primary headache syndrome). In other cases, this condition is caused by pressure on or irritation of one of the two occipital nerves. Pressure and irritation may be due to:  Muscle spasm in the neck.  Neck injury.  Wear and tear of the vertebrae in the neck (osteoarthritis).  Disease of the disks that separate the vertebrae.  Swollen blood vessels that put pressure on the occipital nerves.  Infections.  Tumors.  Diabetes. What are the signs or symptoms? This condition causes brief burning, stabbing, electric, shocking, or shooting pain in the back of the head that can radiate to the top of the head. It can happen on one side or both sides of the head. It can also cause:  Pain behind the eye.  Pain triggered by neck movement or hair brushing.  Scalp tenderness.  Aching in the back of the head between episodes of very bad pain.  Pain that gets worse with exposure to bright lights. How is this diagnosed? Your health care provider may diagnose the condition based on a physical exam and your symptoms. Tests may be done, such as:  Imaging studies of the brain and neck (cervical spine), such as an MRI or CT scan.  These look for causes of pinched nerves.  Applying pressure to the nerves in the neck to try to re-create the pain.  Injection of numbing medicine into the occipital nerve areas to see if pain goes away (diagnostic nerve block).   How is this treated? Treatment for this condition may begin with simple measures, such as:  Rest.  Massage.  Applying heat or cold to the area.  Over-the-counter pain relievers. If these measures do not work, you may need other treatments,  including:  Medicines, such as: ? Prescription-strength anti-inflammatory medicines. ? Muscle relaxants. ? Anti-seizure medicines, which can relieve pain. ? Antidepressants, which can relieve pain. ? Injected medicines, such as medicines that numb the area (local anesthetic) and steroids.  Pulsed radiofrequency ablation. This is when wires are implanted to deliver electrical impulses that block pain signals from the occipital nerve.  Surgery to relieve nerve pressure.  Physical therapy. Follow these instructions at home: Managing pain  Avoid any activities that cause pain.  Rest when you have an attack of pain.  Try gentle massage to relieve pain.  Try a different pillow or sleeping position.  If directed, apply heat to the affected area as often as told by your health care provider. Use the heat source that your health care provider recommends, such as a moist heat pack or a heating pad. ? Place a towel between your skin and the heat source. ? Leave the heat on for 20-30 minutes. ? Remove the heat if your skin turns bright red. This is especially important if you are unable to feel pain, heat, or cold. You have a greater risk of getting burned.  If directed, put ice on the back of your head and neck area. To do this: ? Put ice in a plastic bag. ? Place a towel between your skin and the bag. ? Leave the ice on for 20 minutes, 2-3 times a day. ? Remove the ice if your skin turns bright red. This is very important. If you cannot feel pain, heat, or cold, you have a greater risk of damage to the area.      General instructions  Take over-the-counter and prescription medicines only as told by your health care provider.  Avoid things that make your symptoms worse, such as bright lights.  Try to stay active. Get regular exercise that does not cause pain. Ask your health care provider to suggest safe exercises for you.  Work with a physical therapist to learn stretching  exercises you can do at home.  Practice good posture.  Keep all follow-up visits. This is important. Contact a health care provider if:  Your medicine is not working.  You have new or worsening symptoms. Get help right away if:  You have very bad head pain that does not go away.  You have a sudden change in vision, balance, or speech. These symptoms may represent a serious problem that is an emergency. Do not wait to see if the symptoms will go away. Get medical help right away. Call your local emergency services (911 in the [place] Do not drive yourself to the hospital. Summary  Occipital neuralgia is a type of headache that causes brief episodes of very bad pain in the back of the head.  Pain from occipital neuralgia may spread (radiate) to other parts of the head.  Treatment for this condition includes rest, massage, and medicines. This information is not intended to replace advice given to you by your health care provider. Make  sure you discuss any questions you have with your health care provider. Document Revised: 07/20/2020 Document Reviewed: 07/20/2020 Elsevier Patient Education  2021 [employer]   Labyrinthitis  Labyrinthitis is an infection of the inner ear. Your inner ear is made up of tubes and canals (labyrinth). These are filled with fluid. There are nerve cells in your inner ear that send hearing and balance signals to your brain. When germs get inside the tubes and canals, they harm the nerve cells that send signals to the brain. This condition is often caused by a virus. You may have:  Dizziness.  Ringing in the ears.  Loss of hearing.  Loss of balance.  Stomach upset.  Throwing up. Follow these instructions at home: Medicines  Take over-the-counter and prescription medicines only as told by your doctor.  If you were given an antibiotic medicine, take it as told by your doctor. Do not stop taking the antibiotic even if you start to feel  better. Activity  Rest as told by your doctor.  Limit the things you do as told. Ask your doctor what is safe for you to do.  Do not make any sudden movements until you no longer feel dizzy.  Do physical therapy as told by your doctor. General instructions  Avoid loud noises and bright lights.  Do not drive until your doctor says that it is safe for you.  Drink enough fluid to keep your pee (urine) pale yellow.  Keep all follow-up visits as told by your doctor. This is important. Contact a doctor if:  Your symptoms do not get better.  You do not get better after two weeks.  You have a fever. Get help right away if:  You are very dizzy.  You keep throwing up.  You keep feeling sick to your stomach.  Your hearing gets much worse all of a sudden. Summary  Labyrinthitis is an infection of the inner ear.  This condition is often caused by a virus. Symptoms include dizziness, hearing loss, and ringing in the ears.  Treatment depends on the cause. Follow what your doctor tells you. This information is not intended to replace advice given to you by your health care provider. Make sure you discuss any questions you have with your health care provider. Document Revised: 05/23/2020 Document Reviewed: 05/23/2020 Elsevier Patient Education  2021 [employer]

## 2020-11-18 LAB — TSH: TSH: 2.78 u[IU]/mL (ref 0.450–4.500)

## 2020-11-27 ENCOUNTER — Ambulatory Visit (INDEPENDENT_AMBULATORY_CARE_PROVIDER_SITE_OTHER): Payer: BC Managed Care – PPO | Admitting: Neurology

## 2020-11-27 DIAGNOSIS — M542 Cervicalgia: Secondary | ICD-10-CM

## 2020-11-27 DIAGNOSIS — G8929 Other chronic pain: Secondary | ICD-10-CM

## 2020-11-27 DIAGNOSIS — R200 Anesthesia of skin: Secondary | ICD-10-CM

## 2020-11-27 DIAGNOSIS — Z0289 Encounter for other administrative examinations: Secondary | ICD-10-CM

## 2020-11-27 DIAGNOSIS — R202 Paresthesia of skin: Secondary | ICD-10-CM | POA: Diagnosis not present

## 2020-11-27 DIAGNOSIS — M5481 Occipital neuralgia: Secondary | ICD-10-CM | POA: Diagnosis not present

## 2020-11-27 MED ORDER — METHYLPREDNISOLONE ACETATE 80 MG/ML IJ SUSP
80.0000 mg | Freq: Once | INTRAMUSCULAR | Status: AC
  Administered 2020-11-27: 80 mg via INTRAMUSCULAR

## 2020-11-27 NOTE — Procedures (Signed)
        Full Name: [name] Gender: Female MRN #: [id number] Date of Birth: 1991/08/07    Visit Date: 11/27/2020 07:38 Age: 30 Years Examining Physician: Naomie Dean, MD      History: Right neck pain with radiation into the arm and hand Summary: EMG/NCS was performed on the right upper extremity. All nerves and muscles (as indicated in the following tables) were within normal limits.   Conclusion: This is a normal study  ------------------------------- Naomie Dean, M.D.  [facility] [address] [place], [place] [address] Tel: [phone] Fax: [phone]  Verbal informed consent was obtained from the patient, patient was informed of potential risk of procedure, including bruising, bleeding, hematoma formation, infection, muscle weakness, muscle pain, numbness, among others.        MNC    Nerve / Sites Muscle Latency Ref. Amplitude Ref. Rel Amp Segments Distance Velocity Ref. Area    ms ms mV mV %  cm m/s m/s mVms  R Median - APB     Wrist APB 3.4 ?4.4 6.2 ?4.0 100 Wrist - APB 7   18.1     Upper arm APB 6.5  5.8  93 Upper arm - Wrist 18 58 ?49 16.9  R Ulnar - ADM     Wrist ADM 2.3 ?3.3 11.9 ?6.0 100 Wrist - ADM 7   30.9     B.Elbow ADM 4.5  11.6  98 B.Elbow - Wrist 16 72 ?49 30.7     A.Elbow ADM 6.0  11.4  97.9 A.Elbow - B.Elbow 10 68 ?49 29.4         SNC    Nerve / Sites Rec. Site Peak Lat Ref.  Amp Ref. Segments Distance Peak Diff Ref.    ms ms V V  cm ms ms  R Median, Ulnar - Transcarpal comparison     Median Palm Wrist 2.1 ?2.2 122 ?35 Median Palm - Wrist 8       Ulnar Palm Wrist 1.8 ?2.2 61 ?12 Ulnar Palm - Wrist 8          Median Palm - Ulnar Palm  0.4 ?0.4  R Median - Orthodromic (Dig II, Mid palm)     Dig II Wrist 3.1 ?3.4 23 ?10 Dig II - Wrist 13    R Ulnar - Orthodromic, (Dig V, Mid palm)     Dig V Wrist 2.4 ?3.1 17 ?5 Dig V - Wrist 62             F  Wave    Nerve F Lat Ref.   ms ms  R Ulnar - ADM 22.5 ?32.0        EMG Summary Table    Spontaneous MUAP Recruitment  Muscle IA Fib PSW Fasc Other Amp Dur. Poly Pattern  R. Deltoid Normal None None None _______ Normal Normal Normal Normal  R. Triceps brachii Normal None None None _______ Normal Normal Normal Normal  R. Cervical paraspinals (low) Normal None None None _______ Normal Normal Normal Normal  R. Pronator teres Normal None None None _______ Normal Normal Normal Normal  R. First dorsal interosseous Normal None None None _______ Normal Normal Normal Normal  R. Opponens pollicis Normal None None None _______ Normal Normal Normal Normal

## 2020-11-27 NOTE — Progress Notes (Signed)
See procedure note.

## 2020-11-27 NOTE — Progress Notes (Signed)
Performed by Dr. Lucia Gaskins M.D. 25ml Lidocaine 1%([id number] 12/2021),71ml Marcaine 0.5%([id number] 3/23) and 3ml (methylprednisolone 80mg /ml) 5/23) in the 3 30-gauge needle was used. All procedures a documented blood were medically necessary, reasonable and appropriate based on the patient's history, medical diagnosis and physician opinion. Verbal informed consent was obtained from the patient, patient was informed of potential risk of procedure, including bruising, bleeding, hematoma formation, infection, muscle weakness, muscle pain, numbness, transient hypertension, transient hyperglycemia and transient insomnia among others. All areas injected were topically clean with isopropyl rubbing alcohol. Nonsterile nonlatex gloves were worn during the procedure. Pain significantly reduces  1. Greater occipital nerve block [phone]). The greater occipital nerve site was identified at the nuchal line medial to the occipital artery. Medication was injected into the left and right occipital nerve areas and suboccipital areas. Patient's condition is associated with inflammation of the greater occipital nerve and associated multiple groups. Injection was deemed medically necessary, reasonable and appropriate. Injection represents a separate and unique surgical service.

## 2020-11-27 NOTE — Progress Notes (Signed)
        Full Name: [name] Gender: Female MRN #: [id number] Date of Birth: 10/22/1990    Visit Date: 11/27/2020 07:38 Age: 30 Years Examining Physician: Naomie Dean, MD  Referring Physician: Benita Stabile, MD    History: Right neck pain with radiation into the arm and hand Summary: EMG/NCS was performed on the right upper extremity. All nerves and muscles (as indicated in the following tables) were within normal limits.   Conclusion: This is a normal study  ------------------------------- Naomie Dean, M.D.  [facility] [address], [address] [place], [place] [address] Tel: [phone] Fax: [phone]  Verbal informed consent was obtained from the patient, patient was informed of potential risk of procedure, including bruising, bleeding, hematoma formation, infection, muscle weakness, muscle pain, numbness, among others.        MNC    Nerve / Sites Muscle Latency Ref. Amplitude Ref. Rel Amp Segments Distance Velocity Ref. Area    ms ms mV mV %  cm m/s m/s mVms  R Median - APB     Wrist APB 3.4 ?4.4 6.2 ?4.0 100 Wrist - APB 7   18.1     Upper arm APB 6.5  5.8  93 Upper arm - Wrist 18 58 ?49 16.9  R Ulnar - ADM     Wrist ADM 2.3 ?3.3 11.9 ?6.0 100 Wrist - ADM 7   30.9     B.Elbow ADM 4.5  11.6  98 B.Elbow - Wrist 16 72 ?49 30.7     A.Elbow ADM 6.0  11.4  97.9 A.Elbow - B.Elbow 10 68 ?49 29.4         SNC    Nerve / Sites Rec. Site Peak Lat Ref.  Amp Ref. Segments Distance Peak Diff Ref.    ms ms V V  cm ms ms  R Median, Ulnar - Transcarpal comparison     Median Palm Wrist 2.1 ?2.2 122 ?35 Median Palm - Wrist 8       Ulnar Palm Wrist 1.8 ?2.2 61 ?12 Ulnar Palm - Wrist 8          Median Palm - Ulnar Palm  0.4 ?0.4  R Median - Orthodromic (Dig II, Mid palm)     Dig II Wrist 3.1 ?3.4 23 ?10 Dig II - Wrist 13    R Ulnar - Orthodromic, (Dig V, Mid palm)     Dig V Wrist 2.4 ?3.1 17 ?5 Dig V - Wrist 15             F  Wave    Nerve F Lat Ref.    ms ms  R Ulnar - ADM 22.5 ?32.0       EMG Summary Table    Spontaneous MUAP Recruitment  Muscle IA Fib PSW Fasc Other Amp Dur. Poly Pattern  R. Deltoid Normal None None None _______ Normal Normal Normal Normal  R. Triceps brachii Normal None None None _______ Normal Normal Normal Normal  R. Cervical paraspinals (low) Normal None None None _______ Normal Normal Normal Normal  R. Pronator teres Normal None None None _______ Normal Normal Normal Normal  R. First dorsal interosseous Normal None None None _______ Normal Normal Normal Normal  R. Opponens pollicis Normal None None None _______ Normal Normal Normal Normal

## 2020-12-24 ENCOUNTER — Telehealth: Payer: Self-pay | Admitting: Neurology

## 2020-12-24 NOTE — Telephone Encounter (Signed)
Jillian or Tori: Would one of you mind calling patient and scheduling her with Aundra Millet or Amy for a follow up? If they don;t have anything open in the next few weeks tell her we will put her on a cancellation list [facility], can you do that?). Thanks

## 2020-12-25 NOTE — Telephone Encounter (Signed)
Note pt has appt w/ Amy on 3/28.

## 2020-12-29 ENCOUNTER — Encounter: Payer: Self-pay | Admitting: Family Medicine

## 2020-12-29 ENCOUNTER — Ambulatory Visit (INDEPENDENT_AMBULATORY_CARE_PROVIDER_SITE_OTHER): Payer: BC Managed Care – PPO | Admitting: Family Medicine

## 2020-12-29 ENCOUNTER — Other Ambulatory Visit: Payer: Self-pay

## 2020-12-29 VITALS — BP 111/71 | HR 74 | Ht 59.0 in | Wt 130.0 lb

## 2020-12-29 DIAGNOSIS — G8929 Other chronic pain: Secondary | ICD-10-CM | POA: Diagnosis not present

## 2020-12-29 DIAGNOSIS — R519 Headache, unspecified: Secondary | ICD-10-CM

## 2020-12-29 MED ORDER — TOPIRAMATE 25 MG PO TABS: 25.0000 mg | ORAL_TABLET | Freq: Two times a day (BID) | ORAL | 3 refills | Status: DC

## 2020-12-29 NOTE — Progress Notes (Addendum)
Chief Complaint  Patient presents with  . Follow-up    RM 1 alone Pt is having constant pain in face     HISTORY OF PRESENT ILLNESS: 12/29/20 ALL:  [name] is a 30 y.o. female here today for follow up She reported a collection of symptoms when seen by Dr Lucia Gaskins 11/17/2020 including pain of the back of her head, neck pain, right arm pain, jaw tightness, transient dysesthesias of multiple areas of the body. MRI was normal. NCS/EMG was normal. She was given medrol dose pack and cyclobenzaprine (concerns of TMJ). Occipital nerve block on 2/24 was very helpful. Occipital pain has not returned. She called 3/23 reporting pain of the top of her face and head and right elbow/hand pain. She reported that dentist did not feel TMJ was cause of pain and told her to consider trigeminal neuralgia.   She has bilateral pain. Sometimes worse on left. Pain is described as a constant pressure. She is having a hard time telling me what it feels like but states it is always there. Pounding when really bad. Feels pain over the maxillary sinuses, frontal and temporal region of face and head. She denies stabbing or electrical sensation. Worse when she is active. Worse with moving her jaw. She had a shooting pain go up the left side of her neck, yesterday. Shooting pain of multiple areas of the body but not consistent. Cyclobenzaprine and medrol dose pack did help. Sumatriptan helped but made her really dizzy. She is taking Tylenol 1000mg  at least 4/7 days a week.   She was treated with antianxiety/antidepressants that made her really sleepy. She denies excessive anxiety or depression. She is very hesitant to take any medications regularly.    HISTORY (copied from Dr 6/7 previous note)  HPI:  [name] is a 30 y.o. female here as requested by 37, MD for headaches.  PMHx smoker, headaches and dizziness, anxiety on Prozac. She has never been diagnosed with migraines. In November she was sitting in the  stands and felt dizzy, she took hot shower and saw "stars", off balance like on a boat, tingling in her pinky and ring finer mostly, she has a pinch feeling, tightness in the neck, with radiation down the shoulder, her tongue gets tingly she was on antibiotics and now she has a feeling in her throat. She took a bath and everything went blank and she went to the ER, a week before Jan 25th her husband had tested positive for covid she had a sinus infection at that time on the 25th, the left toe fell asleep once, she has some occ numbness in the anterior thighs, she has pain on her scalp, she has never had it before, would hurt to touch her head in the back, her jew is tight. She still has jaw tightness, her 4th-5th digits feel numb, upper teeth feel tight, her mouth doesn't want to close and she clenches. Back of the head still doesn't feel normal, tightness in the neck. No other focal neurologic deficits, associated symptoms, inciting events or modifiable factors.  Reviewed notes, labs and imaging from outside physicians, which showed:   MRI of the brain: Personally reviewed images and agree with the following Brain: Normal cerebral volume. No restricted diffusion to suggest acute infarction. No midline shift, mass effect, evidence of mass lesion, ventriculomegaly, extra-axial collection or acute intracranial hemorrhage. Cervicomedullary junction and pituitary are within normal limits.  26 and white matter signal is within normal limits throughout the  brain. No encephalomalacia or chronic cerebral blood products identified. Deep gray nuclei, brainstem and cerebellum are within normal limits.  Vascular: Major intracranial vascular flow voids are preserved.  Skull and upper cervical spine: Cervical spine detailed separately today. Visible marrow signal is within normal limits for age.  Sinuses/Orbits: Negative orbits. Fluid level and mucosal thickening redemonstrated in the left maxillary  sinus, with mild left ethmoid and sphenoid mucosal thickening.  Other paranasal sinuses are clear.  Other: Mastoid air cells are clear. Grossly normal visible internal auditory structures. Normal stylomastoid foramina. Visible scalp and face appear negative.  IMPRESSION: 1. Normal noncontrast MRI appearance of the brain. 2. Evidence of acute left maxillary sinusitis.   MRI of the cervical spine October 29, 2020 reviewed report:  Isolated cervical disc degeneration at C6-C7 with mild spinal stenosis and up to moderate right C7 neural foraminal stenosis. No spinal cord mass effect or signal abnormality. October 28, 2020: CMP unremarkable August 18, 2020: BMP and CBC unremarkable   I reviewed Dr. Scharlene Gloss notes: Patient presented to the office with complaints of head fullness for 1-1/2 weeks, random shooting pains in different parts of the body and dizziness, medications used were Imitrex nasal 5 mg, vital signs were normal, which I reviewed Dr. 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 Wyline Mood with cardiology, also getting ocular symptoms.    REVIEW OF SYSTEMS: Out of a complete 14 system review of symptoms, the patient complains only of the following symptoms, headaches, neck pain, dysesthesias, anxiety, and all other reviewed systems are negative.    ALLERGIES: Allergies  Allergen Reactions  . Other     Sulfa drugs  . Sulfa Drugs Cross Reactors Hives      HOME MEDICATIONS: Outpatient Medications Prior to Visit  Medication Sig Dispense Refill  . acetaminophen (TYLENOL) 500 MG tablet Take 1,000 mg by mouth as needed.    . cyclobenzaprine (FLEXERIL) 10 MG tablet Take 1 tablet (10 mg total) by mouth at bedtime. 30 tablet 3  . methylPREDNISolone (MEDROL DOSEPAK) 4 MG TBPK tablet Take pills daily all together with food for 6 days. 6-5-4-3-2-1 21 tablet 1   No facility-administered medications prior to visit.     PAST MEDICAL HISTORY: Past Medical History:  Diagnosis Date  . Abnormal Pap smear   . Disc degeneration, lumbar    thoracic, and cervical per pt report  . Fatigue 01/09/2015  . Heart murmur   . HPV (human papilloma virus) infection   . Irritable bowel syndrome (IBS)   . LLQ pain 01/09/2015  . Missed period 01/09/2015  . No pertinent past medical history   . Pregnant   . Psoriasis   . Vaginal Pap smear, abnormal      PAST SURGICAL HISTORY: Past Surgical History:  Procedure Laterality Date  . CESAREAN SECTION  08/03/2012   Procedure: CESAREAN SECTION;  Surgeon: Tilda Burrow, MD;  Location: [facility];  Service: Obstetrics;  Laterality: N/A;  . COLONOSCOPY    . VULVAR LESION REMOVAL Left 09/11/2014   Procedure: EXCISION OF LEFT VULVAR ABSCESS WITH COMPLEX  REPAIR;  Surgeon: Lazaro Arms, MD;  Location: [facility];  Service: Gynecology;  Laterality: Left;  . WISDOM TOOTH EXTRACTION       FAMILY HISTORY: Family History  Problem Relation Age of Onset  . Miscarriages / [place] Mother   . Heart attack Mother        cardiac arrest with heart attack  . Heart failure Mother   . Heart disease Maternal Grandmother   . Cancer Maternal Grandfather        prostate  . Heart disease Maternal Grandfather   . Diabetes Paternal Grandmother   . Heart murmur Father   . Other Neg Hx      SOCIAL HISTORY: Social History   Socioeconomic History  . Marital status: Married    Spouse name: Not on file  . Number of children: 2  .  Years of education: Not on file  . Highest education level: GED or equivalent  Occupational History  . Not on file  Tobacco Use  . Smoking status: Former Smoker    Packs/day: 0.25    Years: 7.00    Pack years: 1.75    Types: Cigarettes    Quit date: 02/08/2014    Years since quitting: 6.8  . Smokeless tobacco: Never Used  Vaping Use  . Vaping Use: Some days  . Substances: THC  Substance and Sexual Activity  . Alcohol use: Yes    Alcohol/week: 12.0 standard drinks    Types: 12 Standard drinks or equivalent per week    Comment: "if I drink that week"  . Drug use: Yes    Comment: THC vape occasionally for desperate relief for pain  . Sexual activity: Yes    Birth control/protection: Surgical    Comment: husband had vasectomy  Other Topics Concern  . 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   Generalized: Well developed, in no acute distress  Cardiology: normal rate and rhythm, no murmur auscultated  Respiratory: clear to auscultation bilaterally    Neurological examination  Mentation: Alert oriented to time, place, history taking. Follows all commands speech and language fluent Cranial nerve II-XII: Pupils were equal round reactive to light. Extraocular movements were full, visual field were full on confrontational test. Facial sensation and strength were normal. Head turning and shoulder shrug  were normal and symmetric. Motor: The motor  testing reveals 5 over 5 strength of all 4 extremities. Good symmetric motor tone is noted throughout.  Sensory: Sensory testing is intact to soft touch on all 4 extremities. No evidence of extinction is noted.  Gait and station: Gait is normal.  Reflexes: Deep tendon reflexes are symmetric and normal bilaterally.     DIAGNOSTIC DATA (LABS, IMAGING, TESTING) - I reviewed patient records, labs, notes, testing and imaging myself where available.  Lab Results  Component Value Date   WBC  7.2 10/28/2020   HGB 12.2 10/28/2020   HCT 36.0 10/28/2020   MCV 91.2 10/28/2020   PLT 310 10/28/2020      Component Value Date/Time   NA 139 10/28/2020 1721   NA 140 01/09/2015 1523   K 4.3 10/28/2020 1721   CL 108 10/28/2020 1721   CO2 23 10/28/2020 1714   GLUCOSE 91 10/28/2020 1721   BUN 16 10/28/2020 1721   BUN 11 01/09/2015 1523   CREATININE 0.80 10/28/2020 1721   CALCIUM 9.1 10/28/2020 1714   PROT 6.1 (L) 10/28/2020 1714   PROT 6.4 01/09/2015 1523   ALBUMIN 3.7 10/28/2020 1714   ALBUMIN 4.5 01/09/2015 1523   AST 26 10/28/2020 1714   ALT 22 10/28/2020 1714   ALKPHOS 66 10/28/2020 1714   BILITOT 0.8 10/28/2020 1714   BILITOT 0.3 01/09/2015 1523   GFRNONAA >60 10/28/2020 1714   GFRAA >60 03/05/2016 0910   No results found for: CHOL, HDL, LDLCALC, LDLDIRECT, TRIG, CHOLHDL No results found for: UJWJ1BHGBA1C No results found for: VITAMINB12 Lab Results  Component Value Date   TSH 2.780 11/17/2020    No flowsheet data found.   No flowsheet data found.   ASSESSMENT AND PLAN  30 y.o. year old female  has a past medical history of Abnormal Pap smear, Disc degeneration, lumbar, Fatigue (01/09/2015), Heart murmur, HPV (human papilloma virus) infection, Irritable bowel syndrome (IBS), LLQ pain (01/09/2015), Missed period (01/09/2015), No pertinent past medical history, Pregnant, Psoriasis, and Vaginal Pap smear, abnormal. here with   Chronic nonintractable headache, unspecified headache  type  [name] returns with concerns of a constant pressure style pain that relieved with cyclobenzaprine and sumatriptan nasal spray. We have discussed concerns of migraines versus rebound versus tension style headaches. I suspect that there could be a component of TMJ and anxiety contributing as she describes the pain mostly of the temporal area that is worse when moving jaw and times when she is not sleeping well. We have discussed this in the office. I am going to start topiramate 25mg  BID. She will start 25mg  daily for two weeks then increase to BID. She will continue Tylenol as needed but advised against regular use. Fortunately, dysesthesias are improved and occipital pain has completely resolved. She does have some pain of right elbow and arm. EMG normal. She was advised to discuss pain management with PCP. I will have her return in 3 months to assess response to starting topiramate. She will continue healthy lifestyle habits. She verbalizes understanding and agreement with this plan.     No orders of the defined types were placed in this encounter.    Meds ordered this encounter  Medications  . topiramate (TOPAMAX) 25 MG tablet    Sig: Take 1 tablet (25 mg total) by mouth 2 (two) times daily.    Dispense:  180 tablet    Refill:  3    Order Specific Question:   Supervising Provider    Answer:   Anson FretHERN, ANTONIA B [id number]]      I spent 20 minutes of face-to-face and non-face-to-face time with patient.  This included previsit chart review, lab review, study review, order entry, electronic health record documentation, patient education.    Shawnie DapperAmy Lomax, MSN, FNP-C 12/29/2020, 12:15 PM  [facility] [address], [address] [place], [place] [address] [phone]   agree with assessment and plan Naomie DeanAntonia Ahern, MD

## 2020-12-29 NOTE — Patient Instructions (Signed)
Below is our plan:  We will start topiramate 25mg  daily for two weeks. If well tolerated, increase topiramate to 25mg  twice daily. We may increase dose to 50mg  twice daily if well tolerated. Please try to avoid regular use of Tylenol.   Please make sure you are staying well hydrated. I recommend 50-60 ounces daily. Well balanced diet and regular exercise encouraged. Consistent sleep schedule with 6-8 hours recommended.   Please continue follow up with care team as directed.   Follow up with me in 3 months   You may receive a survey regarding today's visit. I encourage you to leave honest feed back as I do use this information to improve patient care. Thank you for seeing me today!    Topiramate tablets What is this medicine? TOPIRAMATE (toe PYRE a mate) is used to treat seizures in adults or children with epilepsy. It is also used for the prevention of migraine headaches. This medicine may be used for other purposes; ask your health care provider or pharmacist if you have questions. COMMON BRAND NAME(S): Topamax, Topiragen What should I tell my health care provider before I take this medicine? They need to know if you have any of these conditions:  bleeding disorder  kidney disease  lung disease  suicidal thoughts, plans, or attempt  an unusual or allergic reaction to topiramate, other medicines, foods, dyes, or preservatives  pregnant or trying to get pregnant  breast-feeding How should I use this medicine? Take this medicine by mouth with a glass of water. Follow the directions on the prescription label. Do not cut, crush or chew this medicine. Swallow the tablets whole. You can take it with or without food. If it upsets your stomach, take it with food. Take your medicine at regular intervals. Do not take it more often than directed. Do not stop taking except on your doctor's advice. A special MedGuide will be given to you by the pharmacist with each prescription and refill.  Be sure to read this information carefully each time. Talk to your pediatrician regarding the use of this medicine in children. While this drug may be prescribed for children as young as 742 years of age for selected conditions, precautions do apply. Overdosage: If you think you have taken too much of this medicine contact a poison control center or emergency room at once. NOTE: This medicine is only for you. Do not share this medicine with others. What if I miss a dose? If you miss a dose, take it as soon as you can. If your next dose is to be taken in less than 6 hours, then do not take the missed dose. Take the next dose at your regular time. Do not take double or extra doses. What may interact with this medicine? This medicine may interact with the following medications:  acetazolamide  alcohol  antihistamines for allergy, cough, and cold  aspirin and aspirin-like medicines  atropine  birth control pills  certain medicines for anxiety or sleep  certain medicines for bladder problems like oxybutynin, tolterodine  certain medicines for depression like amitriptyline, fluoxetine, sertraline  certain medicines for seizures like carbamazepine, phenobarbital, phenytoin, primidone, valproic acid, zonisamide  certain medicines for stomach problems like dicyclomine, hyoscyamine  certain medicines for travel sickness like scopolamine  certain medicines for Parkinson's disease like benztropine, trihexyphenidyl  certain medicines that treat or prevent blood clots like warfarin, enoxaparin, dalteparin, apixaban, dabigatran, and rivaroxaban  digoxin  general anesthetics like halothane, isoflurane, methoxyflurane, propofol  hydrochlorothiazide  ipratropium  lithium  medicines that relax muscles for surgery  metformin  narcotic medicines for pain  NSAIDs, medicines for pain and inflammation, like ibuprofen or naproxen  phenothiazines like chlorpromazine, mesoridazine,  prochlorperazine, thioridazine  pioglitazone This list may not describe all possible interactions. Give your health care provider a list of all the medicines, herbs, non-prescription drugs, or dietary supplements you use. Also tell them if you smoke, drink alcohol, or use illegal drugs. Some items may interact with your medicine. What should I watch for while using this medicine? Visit your doctor or health care professional for regular checks on your progress. Tell your health care professional if your symptoms do not start to get better or if they get worse. Do not stop taking except on your health care professional's advice. You may develop a severe reaction. Your health care professional will tell you how much medicine to take. Wear a medical ID bracelet or chain. Carry a card that describes your disease and details of your medicine and dosage times. This medicine can reduce the response of your body to heat or cold. Dress warm in cold weather and stay hydrated in hot weather. If possible, avoid extreme temperatures like saunas, hot tubs, very hot or cold showers, or activities that can cause dehydration such as vigorous exercise. Check with your health care professional if you have severe diarrhea, nausea, and vomiting, or if you sweat a lot. The loss of too much body fluid may make it dangerous for you to take this medicine. You may get drowsy or dizzy. Do not drive, use machinery, or do anything that needs mental alertness until you know how this medicine affects you. Do not stand up or sit up quickly, especially if you are an older patient. This reduces the risk of dizzy or fainting spells. Alcohol may interfere with the effect of this medicine. Avoid alcoholic drinks. Tell your health care professional right away if you have any change in your eyesight. Patients and their families should watch out for new or worsening depression or thoughts of suicide. Also watch out for sudden changes in  feelings such as feeling anxious, agitated, panicky, irritable, hostile, aggressive, impulsive, severely restless, overly excited and hyperactive, or not being able to sleep. If this happens, especially at the beginning of treatment or after a change in dose, call your healthcare professional. This medicine may cause serious skin reactions. They can happen weeks to months after starting the medicine. Contact your health care provider right away if you notice fevers or flu-like symptoms with a rash. The rash may be red or purple and then turn into blisters or peeling of the skin. Or, you might notice a red rash with swelling of the face, lips or lymph nodes in your neck or under your arms. Birth control may not work properly while you are taking this medicine. Talk to your health care professional about using an extra method of birth control. Women should inform their health care professional if they wish to become pregnant or think they might be pregnant. There is a potential for serious side effects and harm to an unborn child. Talk to your health care professional for more information. What side effects may I notice from receiving this medicine? Side effects that you should report to your doctor or health care professional as soon as possible:  allergic reactions like skin rash, itching or hives, swelling of the face, lips, or tongue  blood in the urine  changes in vision  confusion  loss  of memory  pain in lower back or side  pain when urinating  redness, blistering, peeling or loosening of the skin, including inside the mouth  signs and symptoms of bleeding such as bloody or black, tarry stools; red or dark brown urine; spitting up blood or brown material that looks like coffee grounds; red spots on the skin; unusual bruising or bleeding from the eyes, gums, or nose  signs and symptoms of increased acid in the body like breathing fast; fast heartbeat; headache; confusion; unusually weak  or tired; nausea, vomiting  suicidal thoughts, mood changes  trouble speaking or understanding  unusual sweating  unusually weak or tired Side effects that usually do not require medical attention (report to your doctor or health care professional if they continue or are bothersome):  dizziness  drowsiness  fever  loss of appetite  nausea, vomiting  pain, tingling, numbness in the hands or feet  stomach pain  tiredness  upset stomach This list may not describe all possible side effects. Call your doctor for medical advice about side effects. You may report side effects to FDA at 1-800-FDA-1088. Where should I keep my medicine? Keep out of the reach of children and pets. Store between 15 and 30 degrees C (59 and 86 degrees F). Protect from moisture. Keep the container tightly closed. Get rid of any unused medicine after the expiration date. To get rid of medicines that are no longer needed or have expired:  Take the medicine to a medicine take-back program. Check with your pharmacy or law enforcement to find a location.  If you cannot return the medicine, check the label or package insert to see if the medicine should be thrown out in the garbage or flushed down the toilet. If you are not sure, ask your health care provider. If it is safe to put it in the trash, empty the medicine out of the container. Mix the medicine with cat litter, dirt, coffee grounds, or other unwanted substance. Seal the mixture in a bag or container. Put it in the trash. NOTE: This sheet is a summary. It may not cover all possible information. If you have questions about this medicine, talk to your doctor, pharmacist, or health care provider.  2021 Elsevier/Gold Standard (2020-04-03 15:41:57)   Temporomandibular Joint Syndrome  Temporomandibular joint syndrome (TMJ syndrome) is a condition that causes pain in the temporomandibular joints. These joints are located near your ears and allow your jaw to  open and close. For people with TMJ syndrome, chewing, biting, or other movements of the jaw can be difficult or painful. TMJ syndrome is often mild and goes away within a few weeks. However, sometimes the condition becomes a long-term (chronic) problem. What are the causes? This condition may be caused by:  Grinding your teeth or clenching your jaw. Some people do this when they are under stress.  Arthritis.  Injury to the jaw.  Head or neck injury.  Teeth or dentures that are not aligned well. In some cases, the cause of TMJ syndrome may not be known. What are the signs or symptoms? The most common symptom of this condition is an aching pain on the side of the head in the area of the TMJ. Other symptoms may include:  Pain when moving your jaw, such as when chewing or biting.  Being unable to open your jaw all the way.  Making a clicking sound when you open your mouth.  Headache.  Earache.  Neck or shoulder pain. How is  this diagnosed? This condition may be diagnosed based on:  Your symptoms and medical history.  A physical exam. Your health care provider may check the range of motion of your jaw.  Imaging tests, such as X-rays or an MRI. You may also need to see your dentist, who will determine if your teeth and jaw are lined up correctly. How is this treated? TMJ syndrome often goes away on its own. If treatment is needed, the options may include:  Eating soft foods and applying ice or heat.  Medicines to relieve pain or inflammation.  Medicines or massage to relax the muscles.  A splint, bite plate, or mouthpiece to prevent teeth grinding or jaw clenching.  Relaxation techniques or counseling to help reduce stress.  A therapy for pain in which an electrical current is applied to the nerves through the skin (transcutaneous electrical nerve stimulation).  Acupuncture. This is sometimes helpful to relieve pain.  Jaw surgery. This is rarely needed. Follow these  instructions at home: Eating and drinking  Eat a soft diet if you are having trouble chewing.  Avoid foods that require a lot of chewing. Do not chew gum. General instructions  Take over-the-counter and prescription medicines only as told by your health care provider.  If directed, put ice on the painful area. ? Put ice in a plastic bag. ? Place a towel between your skin and the bag. ? Leave the ice on for 20 minutes, 2-3 times a day.  Apply a warm, wet cloth (warm compress) to the painful area as directed.  Massage your jaw area and do any jaw stretching exercises as told by your health care provider.  If you were given a splint, bite plate, or mouthpiece, wear it as told by your health care provider.  Keep all follow-up visits as told by your health care provider. This is important.   Contact a health care provider if:  You are having trouble eating.  You have new or worsening symptoms. Get help right away if:  Your jaw locks open or closed. Summary  Temporomandibular joint syndrome (TMJ syndrome) is a condition that causes pain in the temporomandibular joints. These joints are located near your ears and allow your jaw to open and close.  TMJ syndrome is often mild and goes away within a few weeks. However, sometimes the condition becomes a long-term (chronic) problem.  Symptoms include an aching pain on the side of the head in the area of the TMJ, pain when chewing or biting, and being unable to open your jaw all the way. You may also make a clicking sound when you open your mouth.  TMJ syndrome often goes away on its own. If treatment is needed, it may include medicines to relieve pain, reduce inflammation, or relax the muscles. A splint, bite plate, or mouthpiece may also be used to prevent teeth grinding or jaw clenching. This information is not intended to replace advice given to you by your health care provider. Make sure you discuss any questions you have with your  health care provider. Document Revised: 12/02/2017 Document Reviewed: 11/01/2017 Elsevier Patient Education  2021 [employer].   Analgesic Rebound Headache An analgesic rebound headache, sometimes called a medication overuse headache or a drug-induced headache, is a secondary disorder that is caused by the overuse of pain medicine (analgesic) to treat the original (primary) headache. Any type of primary headache can return as a rebound headache if a person regularly takes analgesics. The types of primary headaches that  are commonly associated with rebound headaches include:  Migraines.  Headaches that are caused by tense muscles in the head and neck area (tension headaches).  Headaches that develop and happen again on one side of the head and around the eye (cluster headaches). If rebound headaches continue, they can become long-term, daily headaches. What are the causes? This condition may be caused by frequent use of:  Over-the-counter medicines such as aspirin, ibuprofen, and acetaminophen.  Sinus-relief medicines and medicines that contain caffeine.  Narcotic pain medicines such as codeine and oxycodone.  Some prescription migraine medicines. What are the signs or symptoms? The symptoms of a rebound headache are the same as the symptoms of the original headache. Some of the symptoms of specific types of headaches include: Migraine headache  Pulsing or throbbing pain on one or both sides of the head.  Severe pain that interferes with daily activities.  Pain that gets worse with physical activity.  Nausea, vomiting, or both.  Pain and sensitivity with exposure to bright light, loud noises, or strong smells.  Visual changes.  Numbness of one or both arms. Tension headache  Pressure around the head.  Dull, aching head pain.  Pain felt over the front and sides of the head.  Tenderness in the muscles of the head, neck, and shoulders. Cluster headache  Severe pain  that begins in or around one eye or temple.  Droopy or swollen eyelid, or redness and tearing in the eye on the same side as the pain.  One-sided head pain.  Nausea.  Runny nose.  Sweaty, pale facial skin.  Restlessness. How is this diagnosed? This condition is diagnosed by:  Reviewing your medical history. This includes the nature of your primary headaches.  Reviewing the types of pain medicines that you have been using to treat your primary headaches and how often you take them. How is this treated? This condition may be treated or managed by:  Discontinuing frequent use of the analgesic medicine. Doing this may worsen your headaches at first, but the pain should eventually become more manageable, less frequent, and less severe.  Seeing a headache specialist. He or she may be able to help you manage your headaches and help make sure there is not another cause of the headaches.  Using methods of stress relief, such as acupuncture, counseling, biofeedback, and massage. Talk with your health care provider about which methods might be good for you. Follow these instructions at home: Medicines  Take over-the-counter and prescription medicines only as told by your health care provider.  Stop the repeated use of pain medicine as told by your health care provider. Stopping can be difficult. Carefully follow instructions from your health care provider.   Lifestyle  Follow a regular sleep schedule. Do not vary the time that you go to bed or the amount that you sleep from day to day. It is important to stay on the same schedule to help prevent headaches. Get 7-9 hours of sleep each night, or the amount recommended by your health care provider.  Exercise regularly. Exercise for at least 30 minutes, 5 times each week.  Limit or manage stress. Consider stress-relief options such as acupuncture, counseling, biofeedback, and massage. Talk with your health care provider about which methods  might be good for you.  Do not drink alcohol.  Do not use any products that contain nicotine or tobacco, such as cigarettes, e-cigarettes, and chewing tobacco. If you need help quitting, ask your health care provider.   General  instructions  Avoid triggers that are known to cause your primary headaches.  Keep all follow-up visits as told by your health care provider. This is important. Contact a health care provider if:  You continue to experience headaches after following treatments that your health care provider recommended. Get help right away if you have:  New headache pain.  Headache pain that is different than what you have experienced in the past.  Numbness or tingling in your arms or legs.  Changes in your speech or vision. Summary  An analgesic rebound headache, sometimes called a medication overuse headache or a drug-induced headache, is caused by the overuse of pain medicine (analgesic) to treat the original (primary) headache.  Any type of primary headache can return as a rebound headache if a person regularly takes analgesics. The types of primary headaches that are commonly associated with rebound headaches include migraines, tension headaches, and cluster headaches.  Analgesic rebound headaches can occur with frequent use of over-the-counter medicines and prescription medicines.  Treatment involves stopping the medicine that is being overused. This will improve headache frequency and severity. This information is not intended to replace advice given to you by your health care provider. Make sure you discuss any questions you have with your health care provider. Document Revised: 10/18/2019 Document Reviewed: 10/18/2019 Elsevier Patient Education  2021 [employer] Inc.  Migraine Headache A migraine headache is a very strong throbbing pain on one side or both sides of your head. This type of headache can also cause other symptoms. It can last from 4 hours to 3 days.  Talk with your doctor about what things may bring on (trigger) this condition. What are the causes? The exact cause of this condition is not known. This condition may be triggered or caused by:  Drinking alcohol.  Smoking.  Taking medicines, such as: ? Medicine used to treat chest pain (nitroglycerin). ? Birth control pills. ? Estrogen. ? Some blood pressure medicines.  Eating or drinking certain products.  Doing physical activity. Other things that may trigger a migraine headache include:  Having a menstrual period.  Pregnancy.  Hunger.  Stress.  Not getting enough sleep or getting too much sleep.  Weather changes.  Tiredness (fatigue). What increases the risk?  Being 60-46 years old.  Being female.  Having a family history of migraine headaches.  Being Caucasian.  Having depression or anxiety.  Being very overweight. What are the signs or symptoms?  A throbbing pain. This pain may: ? Happen in any area of the head, such as on one side or both sides. ? Make it hard to do daily activities. ? Get worse with physical activity. ? Get worse around bright lights or loud noises.  Other symptoms may include: ? Feeling sick to your stomach (nauseous). ? Vomiting. ? Dizziness. ? Being sensitive to bright lights, loud noises, or smells.  Before you get a migraine headache, you may get warning signs (an aura). An aura may include: ? Seeing flashing lights or having blind spots. ? Seeing bright spots, halos, or zigzag lines. ? Having tunnel vision or blurred vision. ? Having numbness or a tingling feeling. ? Having trouble talking. ? Having weak muscles.  Some people have symptoms after a migraine headache (postdromal phase), such as: ? Tiredness. ? Trouble thinking (concentrating). How is this treated?  Taking medicines that: ? Relieve pain. ? Relieve the feeling of being sick to your stomach. ? Prevent migraine headaches.  Treatment may also  include: ? Having acupuncture. ?  Avoiding foods that bring on migraine headaches. ? Learning ways to control your body functions (biofeedback). ? Therapy to help you know and deal with negative thoughts (cognitive behavioral therapy). Follow these instructions at home: Medicines  Take over-the-counter and prescription medicines only as told by your doctor.  Ask your doctor if the medicine prescribed to you: ? Requires you to avoid driving or using heavy machinery. ? Can cause trouble pooping (constipation). You may need to take these steps to prevent or treat trouble pooping:  Drink enough fluid to keep your pee (urine) pale yellow.  Take over-the-counter or prescription medicines.  Eat foods that are high in fiber. These include beans, whole grains, and fresh fruits and vegetables.  Limit foods that are high in fat and sugar. These include fried or sweet foods. Lifestyle  Do not drink alcohol.  Do not use any products that contain nicotine or tobacco, such as cigarettes, e-cigarettes, and chewing tobacco. If you need help quitting, ask your doctor.  Get at least 8 hours of sleep every night.  Limit and deal with stress. General instructions  Keep a journal to find out what may bring on your migraine headaches. For example, write down: ? What you eat and drink. ? How much sleep you get. ? Any change in what you eat or drink. ? Any change in your medicines.  If you have a migraine headache: ? Avoid things that make your symptoms worse, such as bright lights. ? It may help to lie down in a dark, quiet room. ? Do not drive or use heavy machinery. ? Ask your doctor what activities are safe for you.  Keep all follow-up visits as told by your doctor. This is important.      Contact a doctor if:  You get a migraine headache that is different or worse than others you have had.  You have more than 15 headache days in one month. Get help right away if:  Your migraine  headache gets very bad.  Your migraine headache lasts longer than 72 hours.  You have a fever.  You have a stiff neck.  You have trouble seeing.  Your muscles feel weak or like you cannot control them.  You start to lose your balance a lot.  You start to have trouble walking.  You pass out (faint).  You have a seizure. Summary  A migraine headache is a very strong throbbing pain on one side or both sides of your head. These headaches can also cause other symptoms.  This condition may be treated with medicines and changes to your lifestyle.  Keep a journal to find out what may bring on your migraine headaches.  Contact a doctor if you get a migraine headache that is different or worse than others you have had.  Contact your doctor if you have more than 15 headache days in a month. This information is not intended to replace advice given to you by your health care provider. Make sure you discuss any questions you have with your health care provider. Document Revised: 01/12/2019 Document Reviewed: 11/02/2018 Elsevier Patient Education  2021 [employer].   Tension Headache, Adult A tension headache is a feeling of pain, pressure, or aching in the head. It is often felt over the front and sides of the head. Tension headaches can last from 30 minutes to several days. What are the causes? The cause of this condition is not known. Sometimes, tension headaches are brought on by stress, worry (anxiety),  or depression. Other things that may set them off include:  Alcohol.  Too much caffeine or caffeine withdrawal.  Colds, flu, or sinus infections.  Dental problems. This can include clenching your teeth.  Being tired.  Holding your head and neck in the same position for a long time, such as while using a computer.  Smoking.  Arthritis in the neck. What are the signs or symptoms?  Feeling pressure around the head.  A dull ache in the head.  Pain over the front and  sides of the head.  Feeling sore or tender in the muscles of the head, neck, and shoulders. How is this treated? This condition may be treated with lifestyle changes and with medicines that help relieve symptoms. Follow these instructions at home: Managing pain  Take over-the-counter and prescription medicines only as told by your doctor.  When you have a headache, lie down in a dark, quiet room.  If told, put ice on your head and neck. To do this: ? Put ice in a plastic bag. ? Place a towel between your skin and the bag. ? Leave the ice on for 20 minutes, 2-3 times a day. ? Take off the ice if your skin turns bright red. This is very important. If you cannot feel pain, heat, or cold, you have a greater risk of damage to the area.  If told, put heat on the back of your neck. Do this as often as told by your doctor. Use the heat source that your doctor recommends, such as a moist heat pack or a heating pad. ? Place a towel between your skin and the heat source. ? Leave the heat on for 20-30 minutes. ? Take off the heat if your skin turns bright red. This is very important. If you cannot feel pain, heat, or cold, you have a greater risk of getting burned. Eating and drinking  Eat meals on a regular schedule.  If you drink alcohol: ? Limit how much you have to:  0-1 drink a day for women who are not pregnant.  0-2 drinks a day for men. ? Know how much alcohol is in your drink. In the [place] one drink equals one 12 oz bottle of beer (355 mL), one 5 oz glass of wine (148 mL), or one 1 oz glass of hard liquor (44 mL).  Drink enough fluid to keep your pee (urine) pale yellow.  Do not use a lot of caffeine, or stop using caffeine. Lifestyle  Get 7-9 hours of sleep each night. Or get the amount of sleep that your doctor tells you to.  At bedtime, keep computers, phones, and tablets out of your room.  Find ways to lessen your stress. This may include: ? Exercise. ? Deep  breathing. ? Yoga. ? Listening to music. ? Thinking positive thoughts.  Sit up straight. Try to relax your muscles.  Do not smoke or use any products that contain nicotine or tobacco. If you need help quitting, ask your doctor. General instructions  Avoid things that can bring on headaches. Keep a headache journal to see what may bring on headaches. For example, write down: ? What you eat and drink. ? How much sleep you get. ? Any change to your diet or medicines.  Keep all follow-up visits.   Contact a doctor if:  Your headache does not get better.  Your headache comes back.  You have a headache, and sounds, light, or smells bother you.  You feel like you  may vomit, or you vomit.  Your stomach hurts. Get help right away if:  You all of a sudden get a very bad headache with any of these things: ? A stiff neck. ? Feeling like you may vomit. ? Vomiting. ? Feeling mixed up (confused). ? Feeling weak in one part or one side of your body. ? Having trouble seeing or speaking, or both. ? Feeling short of breath. ? A rash. ? Feeling very sleepy. ? Pain in your eye or ear. ? Trouble walking or balancing. ? Feeling like you will faint, or you faint. Summary  A tension headache is pain, pressure, or aching in your head.  Tension headaches can last from 30 minutes to several days.  Lifestyle changes and medicines may help relieve pain. This information is not intended to replace advice given to you by your health care provider. Make sure you discuss any questions you have with your health care provider. Document Revised: 06/19/2020 Document Reviewed: 06/19/2020 Elsevier Patient Education  2021 [employer].

## 2021-03-30 ENCOUNTER — Other Ambulatory Visit: Payer: Self-pay

## 2021-03-30 ENCOUNTER — Ambulatory Visit
Admission: EM | Admit: 2021-03-30 | Discharge: 2021-03-30 | Disposition: A | Discharge status: Home / Self Care | Payer: BC Managed Care – PPO | Attending: Family Medicine | Admitting: Family Medicine

## 2021-03-30 DIAGNOSIS — J069 Acute upper respiratory infection, unspecified: Secondary | ICD-10-CM

## 2021-03-30 LAB — POCT RAPID STREP A (OFFICE): Rapid Strep A Screen: NEGATIVE

## 2021-03-30 MED ORDER — PREDNISONE 20 MG PO TABS: 20.0000 mg | ORAL_TABLET | Freq: Every day | ORAL | 0 refills | Status: AC

## 2021-03-30 MED ORDER — PROMETHAZINE-DM 6.25-15 MG/5ML PO SYRP
5.0000 mL | ORAL_SOLUTION | Freq: Four times a day (QID) | ORAL | 0 refills | Status: DC | PRN
Start: 2021-03-30 — End: 2021-07-22

## 2021-03-30 NOTE — ED Provider Notes (Signed)
[facility]    CSN: [id number] Arrival date & time: 03/30/21  1204      History   Chief Complaint Chief Complaint  Patient presents with   Sore Throat   Fever    HPI [name] is a 30 y.o. female.   HPI Patient presents with URI symptoms including cough, sore throat, otalgia, nasal congestion, runny nose, and sinus pressure.  Child and spouse have been sick. Reports fever for 4 days with TMAX 104. Afebrile for the last 24 hours. Denies worrisome symptoms of shortness of breath, weakness, N&V, chest pain.  Past Medical History:  Diagnosis Date   Abnormal Pap smear    Disc degeneration, lumbar    thoracic, and cervical per pt report   Fatigue 01/09/2015   Heart murmur    HPV (human papilloma virus) infection    Irritable bowel syndrome (IBS)    LLQ pain 01/09/2015   Missed period 01/09/2015   No pertinent past medical history    Pregnant    Psoriasis    Vaginal Pap smear, abnormal     Patient Active Problem List   Diagnosis Date Noted   Fatigue 01/09/2015   Missed period 01/09/2015   LLQ pain 01/09/2015   Monilial vulvovaginitis 01/21/2014   Boil of vulva 01/18/2014   Smoker 05/01/2013    Past Surgical History:  Procedure Laterality Date   CESAREAN SECTION  08/03/2012   Procedure: CESAREAN SECTION;  Surgeon: Tilda Burrow, MD;  Location: [facility];  Service: Obstetrics;  Laterality: N/A;   COLONOSCOPY     VULVAR LESION REMOVAL Left 09/11/2014   Procedure: EXCISION OF LEFT VULVAR ABSCESS WITH COMPLEX REPAIR;  Surgeon: Lazaro Arms, MD;  Location: [facility];  Service: Gynecology;  Laterality: Left;   WISDOM TOOTH EXTRACTION      OB History     Gravida  2   Para  2   Term  2   Preterm  0   AB  0   Living  2      SAB  0   IAB  0   Ectopic  0   Multiple  0   Live Births  2            Home Medications    Prior to Admission medications   Medication Sig Start Date End Date Taking? Authorizing Provider  acetaminophen (TYLENOL)  500 MG tablet Take 1,000 mg by mouth as needed.   Yes [provider]  cyclobenzaprine (FLEXERIL) 10 MG tablet Take 1 tablet (10 mg total) by mouth at bedtime. 11/17/20   Anson Fret, MD  methylPREDNISolone (MEDROL DOSEPAK) 4 MG TBPK tablet Take pills daily all together with food for 6 days. 6-5-4-3-2-1 11/17/20   Anson Fret, MD  topiramate (TOPAMAX) 25 MG tablet Take 1 tablet (25 mg total) by mouth 2 (two) times daily. 12/29/20   Shawnie Dapper, NP    Family History Family History  Problem Relation Age of Onset   Miscarriages / Stillbirths Mother    Heart attack Mother        cardiac arrest with heart attack   Heart failure Mother    Heart disease Maternal Grandmother    Cancer Maternal Grandfather        prostate   Heart disease Maternal Grandfather    Diabetes Paternal Grandmother    Heart murmur Father    Other Neg Hx     Social History Social History   Tobacco Use  Smoking status: Former    Packs/day: 0.25    Years: 7.00    Pack years: 1.75    Types: Cigarettes    Quit date: 02/08/2014    Years since quitting: 7.1   Smokeless tobacco: Never  Vaping Use   Vaping Use: Former   Substances: THC  Substance Use Topics   Alcohol use: Yes    Alcohol/week: 12.0 standard drinks    Types: 12 Standard drinks or equivalent per week    Comment: "if I drink that week"   Drug use: Not Currently    Comment: THC vape occasionally for desperate relief for pain     Allergies   Other and Sulfa drugs cross reactors   Review of Systems Review of Systems Pertinent negatives listed in HPI   Physical Exam Triage Vital Signs ED Triage Vitals  Enc Vitals Group     BP 03/30/21 1320 117/70     Pulse Rate 03/30/21 1320 95     Resp 03/30/21 1320 18     Temp 03/30/21 1320 98.6 F (37 C)     Temp Source 03/30/21 1320 Oral     SpO2 03/30/21 1320 95 %     Weight --      Height --      Head Circumference --      Peak Flow --      Pain Score 03/30/21 1315 7     Pain  Loc --      Pain Edu? --      Excl. in GC? --    No data found.  Updated Vital Signs BP 117/70 (BP Location: Right Arm)   Pulse 95   Temp 98.6 F (37 C) (Oral)   Resp 18   LMP 03/19/2021 (Exact Date)   SpO2 95%   Visual Acuity Right Eye Distance:   Left Eye Distance:   Bilateral Distance:    Right Eye Near:   Left Eye Near:    Bilateral Near:     Physical Exam  General Appearance:    Alert, cooperative, no distress  HENT:  Normocephalic, ears normal, nares mucosal edema with congestion, rhinorrhea, oropharynx mildly erythematous w/mild tonsillar enlargement no exudate   Eyes:    PERRL, conjunctiva/corneas clear, EOM's intact       Lungs:     Clear to auscultation bilaterally, respirations unlabored  Heart:    Regular rate and rhythm  Neurologic:   Awake, alert, oriented x 3. No apparent focal neurological           defect.       UC Treatments / Results  Labs (all labs ordered are listed, but only abnormal results are displayed) Labs Reviewed  COVID-19, FLU A+B NAA  POCT RAPID STREP A (OFFICE)    EKG   Radiology No results found.  Procedures Procedures (including critical care time)  Medications Ordered in UC Medications - No data to display  Initial Impression / Assessment and Plan / UC Course  I have reviewed the triage vital signs and the nursing notes.  Pertinent labs & imaging results that were available during my care of the patient were reviewed by me and considered in my medical decision making (see chart for details).     Viral URI with cough.  COVID/flu test pending.  Treatment per discharge instructions.  Symptoms appear viral in etiology therefore symptom management warranted only.  Return precautions if symptoms worsen or do not improve. Final Clinical Impressions(s) / UC Diagnoses   Final  diagnoses:  Viral URI with cough     Discharge Instructions      Your COVID 19 results should result within 3-5 days. Negative results are  immediately resulted to Mychart. Positive results will receive a follow-up call from our clinic. If symptoms are present, I recommend home quarantine until results are known.  Alternate Tylenol and ibuprofen as needed for body aches and fever.  Symptom management per recommendations discussed today.  If any breathing difficulty or chest pain develops go immediately to the closest emergency department for evaluation.     ED Prescriptions     Medication Sig Dispense Auth. Provider   predniSONE (DELTASONE) 20 MG tablet Take 1 tablet (20 mg total) by mouth daily with breakfast for 5 days. 5 tablet Bing Neighbors, FNP   promethazine-dextromethorphan (PROMETHAZINE-DM) 6.25-15 MG/5ML syrup Take 5 mLs by mouth 4 (four) times daily as needed for cough. 140 mL Bing Neighbors, FNP      PDMP not reviewed this encounter.   Bing Neighbors, FNP 03/30/21 [id number]

## 2021-03-30 NOTE — ED Triage Notes (Signed)
Pt presents with ongoing fever, chills, HA, neck pain, R ear pain, sore throat x 1 week.  Temp was up to 104 at home.  Took Tylenol PTA.

## 2021-03-30 NOTE — Discharge Instructions (Signed)
Your COVID 19 results should result within 3-5 days. °Negative results are immediately resulted to Mychart.  ° °Positive results will receive a follow-up call from our clinic. If symptoms are present, I recommend home quarantine until results are known.  ° °Alternate Tylenol and ibuprofen as needed for body aches and fever.  Symptom management per recommendations discussed today.  If any breathing difficulty or chest pain develops go immediately to the closest emergency department for evaluation.  °

## 2021-03-31 LAB — COVID-19, FLU A+B NAA
Influenza A, NAA: NOT DETECTED
Influenza B, NAA: NOT DETECTED
SARS-CoV-2, NAA: NOT DETECTED

## 2021-04-01 ENCOUNTER — Encounter: Payer: Self-pay | Admitting: Family Medicine

## 2021-04-01 ENCOUNTER — Ambulatory Visit: Payer: BC Managed Care – PPO | Admitting: Family Medicine

## 2021-04-01 NOTE — Progress Notes (Deleted)
No chief complaint on file.    HISTORY OF PRESENT ILLNESS: 04/01/21 ALL:  [name] returns for follow up for migraines with multiple areas of dysesthesias. We discussed multiple factors contributing to headaches at last visit and I advised that she try topiramate. She was in agreement but later decided that she did not want to take medicaitons due to potential side effects. She has been very frustrated as she wishes to understand the cause of her symptoms.   12/29/2020 ALL:  [name] is a 30 y.o. female here today for follow up She reported a collection of symptoms when seen by Dr Lucia Gaskins 11/17/2020 including pain of the back of her head, neck pain, right arm pain, jaw tightness, transient dysesthesias of multiple areas of the body. MRI was normal. NCS/EMG was normal. She was given medrol dose pack and cyclobenzaprine (concerns of TMJ). Occipital nerve block on 2/24 was very helpful. Occipital pain has not returned. She called 3/23 reporting pain of the top of her face and head and right elbow/hand pain. She reported that dentist did not feel TMJ was cause of pain and told her to consider trigeminal neuralgia.   She has bilateral pain. Sometimes worse on left. Pain is described as a constant pressure. She is having a hard time telling me what it feels like but states it is always there. Pounding when really bad. Feels pain over the maxillary sinuses, frontal and temporal region of face and head. She denies stabbing or electrical sensation. Worse when she is active. Worse with moving her jaw. She had a shooting pain go up the left side of her neck, yesterday. Shooting pain of multiple areas of the body but not consistent. Cyclobenzaprine and medrol dose pack did help. Sumatriptan helped but made her really dizzy. She is taking Tylenol 1000mg  at least 4/7 days a week.   She was treated with antianxiety/antidepressants that made her really sleepy. She denies excessive anxiety or depression. She is very  hesitant to take any medications regularly.    HISTORY (copied from Dr 6/7 previous note)  HPI:  [name] is a 30 y.o. female here as requested by 37, MD for headaches.  PMHx smoker, headaches and dizziness, anxiety on Prozac. She has never been diagnosed with migraines. In November she was sitting in the stands and felt dizzy, she took hot shower and saw "stars", off balance like on a boat, tingling in her pinky and ring finer mostly, she has a pinch feeling, tightness in the neck, with radiation down the shoulder, her tongue gets tingly she was on antibiotics and now she has a feeling in her throat. She took a bath and everything went blank and she went to the ER, a week before Jan 25th her husband had tested positive for covid she had a sinus infection at that time on the 25th, the left toe fell asleep once, she has some occ numbness in the anterior thighs, she has pain on her scalp, she has never had it before, would hurt to touch her head in the back, her jew is tight. She still has jaw tightness, her 4th-5th digits feel numb, upper teeth feel tight, her mouth doesn't want to close and she clenches. Back of the head still doesn't feel normal, tightness in the neck. No other focal neurologic deficits, associated symptoms, inciting events or modifiable factors.   Reviewed notes, labs and imaging from outside physicians, which showed:    MRI of the brain: Personally reviewed  images and agree with the following Brain: Normal cerebral volume. No restricted diffusion to suggest acute infarction. No midline shift, mass effect, evidence of mass lesion, ventriculomegaly, extra-axial collection or acute intracranial hemorrhage. Cervicomedullary junction and pituitary are within normal limits.   Wallace Cullens and white matter signal is within normal limits throughout the brain. No encephalomalacia or chronic cerebral blood products identified. Deep gray nuclei, brainstem and cerebellum are  within normal limits.   Vascular: Major intracranial vascular flow voids are preserved.   Skull and upper cervical spine: Cervical spine detailed separately today. Visible marrow signal is within normal limits for age.   Sinuses/Orbits: Negative orbits. Fluid level and mucosal thickening redemonstrated in the left maxillary sinus, with mild left ethmoid and sphenoid mucosal thickening.   Other paranasal sinuses are clear.   Other: Mastoid air cells are clear. Grossly normal visible internal auditory structures. Normal stylomastoid foramina. Visible scalp and face appear negative.   IMPRESSION: 1. Normal noncontrast MRI appearance of the brain. 2. Evidence of acute left maxillary sinusitis.     MRI of the cervical spine October 29, 2020 reviewed report:   Isolated cervical disc degeneration at C6-C7 with mild spinal stenosis and up to moderate right C7 neural foraminal stenosis. No spinal cord mass effect or signal abnormality. October 28, 2020: CMP unremarkable August 18, 2020: BMP and CBC unremarkable    I reviewed Dr. Scharlene Gloss notes: Patient presented to the office with complaints of head fullness for 1-1/2 weeks, random shooting pains in different parts of the body and dizziness, medications used were Imitrex nasal 5 mg, vital signs were normal, which I reviewed Dr. 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 Wyline Mood with cardiology, also getting ocular  symptoms.    REVIEW OF SYSTEMS: Out of a complete 14 system review of symptoms, the patient complains only of the following symptoms, headaches, neck pain, dysesthesias, anxiety, and all other reviewed systems are negative.    ALLERGIES: Allergies  Allergen Reactions   Other     Sulfa drugs   Sulfa Drugs Cross Reactors Hives     HOME MEDICATIONS: Outpatient Medications Prior to Visit  Medication Sig Dispense Refill   acetaminophen (TYLENOL) 500 MG tablet Take 1,000 mg by mouth as needed.     cyclobenzaprine (FLEXERIL) 10 MG tablet Take 1 tablet (10 mg total) by mouth at bedtime. 30 tablet 3   predniSONE (DELTASONE) 20 MG tablet Take 1 tablet (20 mg total) by mouth daily with breakfast for 5 days. 5 tablet 0   promethazine-dextromethorphan (PROMETHAZINE-DM) 6.25-15 MG/5ML syrup Take 5 mLs by mouth 4 (four) times daily as needed for cough. 140 mL 0   topiramate (TOPAMAX) 25 MG tablet Take 1 tablet (25 mg total) by mouth 2 (two) times daily. 180 tablet 3   No facility-administered medications prior to visit.     PAST MEDICAL HISTORY: Past Medical History:  Diagnosis Date   Abnormal Pap smear    Disc degeneration, lumbar    thoracic, and cervical per pt report   Fatigue 01/09/2015   Heart murmur  HPV (human papilloma virus) infection    Irritable bowel syndrome (IBS)    LLQ pain 01/09/2015   Missed period 01/09/2015   No pertinent past medical history    Pregnant    Psoriasis    Vaginal Pap smear, abnormal      PAST SURGICAL HISTORY: Past Surgical History:  Procedure Laterality Date   CESAREAN SECTION  08/03/2012   Procedure: CESAREAN SECTION;  Surgeon: Tilda BurrowJohn V Ferguson, MD;  Location: [facility];  Service: Obstetrics;  Laterality: N/A;   COLONOSCOPY     VULVAR LESION REMOVAL Left 09/11/2014   Procedure: EXCISION OF LEFT VULVAR ABSCESS WITH COMPLEX REPAIR;  Surgeon: Lazaro ArmsLuther H Eure, MD;  Location: [facility];  Service: Gynecology;  Laterality: Left;   WISDOM TOOTH EXTRACTION        FAMILY HISTORY: Family History  Problem Relation Age of Onset   Miscarriages / Stillbirths Mother    Heart attack Mother        cardiac arrest with heart attack   Heart failure Mother    Heart disease Maternal Grandmother    Cancer Maternal Grandfather        prostate   Heart disease Maternal Grandfather    Diabetes Paternal Grandmother    Heart murmur Father    Other Neg Hx      SOCIAL HISTORY: Social History   Socioeconomic History   Marital status: Married    Spouse name: Not on file   Number of children: 2   Years of education: Not on file   Highest education level: GED or equivalent  Occupational History   Not on file  Tobacco Use   Smoking status: Former    Packs/day: 0.25    Years: 7.00    Pack years: 1.75    Types: Cigarettes    Quit date: 02/08/2014    Years since quitting: 7.1   Smokeless tobacco: Never  Vaping Use   Vaping Use: Former   Substances: THC  Substance and Sexual Activity   Alcohol use: Yes    Alcohol/week: 12.0 standard drinks    Types: 12 Standard drinks or equivalent per week    Comment: "if I drink that week"   Drug use: Not Currently    Comment: THC vape occasionally for desperate relief for pain   Sexual activity: Yes    Birth control/protection: Surgical    Comment: husband had vasectomy  Other Topics Concern   Not on file  Social History Narrative   Lives at home with husband and children   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  There is no height or weight on file to calculate BMI.   Generalized: Well developed, in no acute distress  Cardiology:  normal rate and rhythm, no murmur auscultated  Respiratory: clear to auscultation bilaterally    Neurological examination  Mentation: Alert oriented to time, place, history taking. Follows all commands speech and language fluent Cranial nerve II-XII: Pupils were equal round reactive to light. Extraocular movements were full, visual field were full on confrontational test. Facial sensation and strength were normal. Head turning  and shoulder shrug  were normal and symmetric. Motor: The motor testing reveals 5 over 5 strength of all 4 extremities. Good symmetric motor tone is noted throughout.  Sensory: Sensory testing is intact to soft touch on all 4 extremities. No evidence of extinction is noted.  Gait and station: Gait is normal.  Reflexes: Deep tendon reflexes are symmetric and normal bilaterally.     DIAGNOSTIC DATA (LABS, IMAGING, TESTING) - I reviewed patient records, labs, notes, testing and imaging myself where available.  Lab Results  Component Value Date   WBC 7.2 10/28/2020   HGB 12.2 10/28/2020   HCT 36.0 10/28/2020   MCV 91.2 10/28/2020   PLT 310 10/28/2020      Component Value Date/Time   NA 139 10/28/2020 1721   NA 140 01/09/2015 1523   K 4.3 10/28/2020 1721   CL 108 10/28/2020 1721   CO2 23 10/28/2020 1714   GLUCOSE 91 10/28/2020 1721   BUN 16 10/28/2020 1721   BUN 11 01/09/2015 1523   CREATININE 0.80 10/28/2020 1721   CALCIUM 9.1 10/28/2020 1714   PROT 6.1 (L) 10/28/2020 1714   PROT 6.4 01/09/2015 1523   ALBUMIN 3.7 10/28/2020 1714   ALBUMIN 4.5 01/09/2015 1523   AST 26 10/28/2020 1714   ALT 22 10/28/2020 1714   ALKPHOS 66 10/28/2020 1714   BILITOT 0.8 10/28/2020 1714   BILITOT 0.3 01/09/2015 1523   GFRNONAA >60 10/28/2020 1714   GFRAA >60 03/05/2016 0910   No results found for: CHOL, HDL, LDLCALC, LDLDIRECT, TRIG, CHOLHDL No results found for: PPJK9T No results found for: VITAMINB12 Lab Results  Component Value Date   TSH 2.780 11/17/2020    No  flowsheet data found.   No flowsheet data found.   ASSESSMENT AND PLAN  30 y.o. year old female  has a past medical history of Abnormal Pap smear, Disc degeneration, lumbar, Fatigue (01/09/2015), Heart murmur, HPV (human papilloma virus) infection, Irritable bowel syndrome (IBS), LLQ pain (01/09/2015), Missed period (01/09/2015), No pertinent past medical history, Pregnant, Psoriasis, and Vaginal Pap smear, abnormal. here with   No diagnosis found.  Brieonna returns with concerns of a constant pressure style pain that relieved with cyclobenzaprine and sumatriptan nasal spray. We have discussed concerns of migraines versus rebound versus tension style headaches. I suspect that there could be a component of TMJ and anxiety contributing as she describes the pain mostly of the temporal area that is worse when moving jaw and times when she is not sleeping well. We have discussed this in the office. I am going to start topiramate 25mg  BID. She will start 25mg  daily for two weeks then increase to BID. She will continue Tylenol as needed but advised against regular use. Fortunately, dysesthesias are improved and occipital pain has completely resolved. She does have some pain of right elbow and arm. EMG normal. She was advised to discuss pain management with PCP. I will have her return in 3 months to assess response to starting topiramate. She will continue healthy lifestyle habits. She verbalizes understanding and agreement with this plan.     No orders of the defined types were placed in this encounter.    No orders of the defined types were placed in this encounter.   , MSN, FNP-C 04/01/2021, 9:32 AM  [facility] [address] [place], [address] [place] [phone]

## 2021-04-24 ENCOUNTER — Encounter: Payer: Self-pay | Admitting: Family Medicine

## 2021-06-01 DIAGNOSIS — M542 Cervicalgia: Secondary | ICD-10-CM | POA: Diagnosis not present

## 2021-06-01 DIAGNOSIS — M5412 Radiculopathy, cervical region: Secondary | ICD-10-CM | POA: Diagnosis not present

## 2021-06-01 DIAGNOSIS — Z6826 Body mass index (BMI) 26.0-26.9, adult: Secondary | ICD-10-CM | POA: Diagnosis not present

## 2021-06-24 ENCOUNTER — Encounter: Payer: BC Managed Care – PPO | Admitting: Adult Health

## 2021-07-22 ENCOUNTER — Other Ambulatory Visit: Payer: Self-pay

## 2021-07-22 ENCOUNTER — Other Ambulatory Visit (HOSPITAL_COMMUNITY)
Admission: RE | Admit: 2021-07-22 | Discharge: 2021-07-22 | Disposition: A | Discharge status: Home / Self Care | Payer: BC Managed Care – PPO | Source: Ambulatory Visit | Attending: Adult Health | Admitting: Adult Health

## 2021-07-22 ENCOUNTER — Ambulatory Visit (INDEPENDENT_AMBULATORY_CARE_PROVIDER_SITE_OTHER): Payer: BC Managed Care – PPO | Admitting: Obstetrics & Gynecology

## 2021-07-22 ENCOUNTER — Encounter: Payer: Self-pay | Admitting: Obstetrics & Gynecology

## 2021-07-22 VITALS — BP 116/84 | HR 69 | Ht 59.0 in | Wt 131.6 lb

## 2021-07-22 DIAGNOSIS — Z124 Encounter for screening for malignant neoplasm of cervix: Secondary | ICD-10-CM | POA: Insufficient documentation

## 2021-07-22 NOTE — Progress Notes (Signed)
   GYN VISIT Patient name: [name] MRN [id number]  Date of birth: 15-May-1991 Chief Complaint:   Gynecologic Exam (Has a hard place on her cervix and a knot in vaginal area)  History of Present Illness:   [name] is a 30 y.o. G77P2002 female being seen today for the following concern:  -Cervical concern: Notes bump on her cervix- felt it when she had issues with a tampon.  Additionally notes a discomfort with orgasm.  Does not report regular dyspareunia.  Denies dysuria.   Denies irregular bleeding, discharge, itching or irritation.  She notes h/o cervical dysplasia and was concerned.  Last seen Dr. Emelda Fear 2015- vulvar abscess  Patient's last menstrual period was 07/09/2021 (exact date).  Vasectomy for contraception   Review of Systems:   Pertinent items are noted in HPI Denies fever/chills, dizziness, headaches, visual disturbances, fatigue, shortness of breath, chest pain, abdominal pain, vomiting,  problems with periods, bowel movements, urination unless otherwise stated above.  Pertinent History Reviewed:  Reviewed past medical,surgical, social, obstetrical and family history.  Reviewed problem list, medications and allergies. Physical Assessment:   Vitals:   07/22/21 1355  BP: 116/84  Pulse: 69  Weight: 131 lb 9.6 oz (59.7 kg)  Height: 4\' 11"  (1.499 m)  Body mass index is 26.58 kg/m.       Physical Examination:   General appearance: alert, well appearing, and in no distress  Psych: mood appropriate, normal affect  Skin: warm & dry   Cardiovascular: normal heart rate noted  Respiratory: normal respiratory effort, no distress  Abdomen: soft, non-tender   Pelvic: normal external genitalia, vulva, vagina, cervix, uterus and adnexa  Extremities: no edema   Chaperone:    Assessment & Plan:  1) Cervical cancer screening -pap collected, further management pending results -suspect the "bump" that she felt was the side of her cervix based on the  position, significant retroflexion   No orders of the defined types were placed in this encounter.   Return in about 1 year (around 07/22/2022) for Annual.   07/24/2022, DO Attending Obstetrician & Gynecologist, [facility] for [facility], [facility]

## 2021-07-24 LAB — CYTOLOGY - PAP
Chlamydia: NEGATIVE
Comment: NEGATIVE
Comment: NEGATIVE
Comment: NORMAL
Diagnosis: NEGATIVE
High risk HPV: NEGATIVE
Neisseria Gonorrhea: NEGATIVE

## 2021-08-12 DIAGNOSIS — G4486 Cervicogenic headache: Secondary | ICD-10-CM | POA: Diagnosis not present

## 2021-09-07 DIAGNOSIS — M5412 Radiculopathy, cervical region: Secondary | ICD-10-CM | POA: Diagnosis not present

## 2022-07-29 DIAGNOSIS — N978 Female infertility of other origin: Secondary | ICD-10-CM | POA: Diagnosis not present

## 2022-07-29 DIAGNOSIS — Z3181 Encounter for male factor infertility in female patient: Secondary | ICD-10-CM | POA: Diagnosis not present

## 2022-08-10 ENCOUNTER — Ambulatory Visit: Payer: Self-pay | Admitting: Obstetrics & Gynecology

## 2022-08-10 DIAGNOSIS — Z539 Procedure and treatment not carried out, unspecified reason: Secondary | ICD-10-CM

## 2022-08-14 IMAGING — CT CT HEAD W/O CM
4 series · 16 of 47 positions shown, 18 images · non-contrast
Comparison: 08/18/2020

CLINICAL DATA: Headache

EXAM:
CT HEAD WITHOUT CONTRAST
TECHNIQUE: Contiguous axial images were obtained from the base of the skull
through the vertex without intravenous contrast.

[Series 3: head bone · axial · 0.39mm/px · z∈[+38,+66]mm · 3 of 69 slices shown]
[im 7/69  bone]
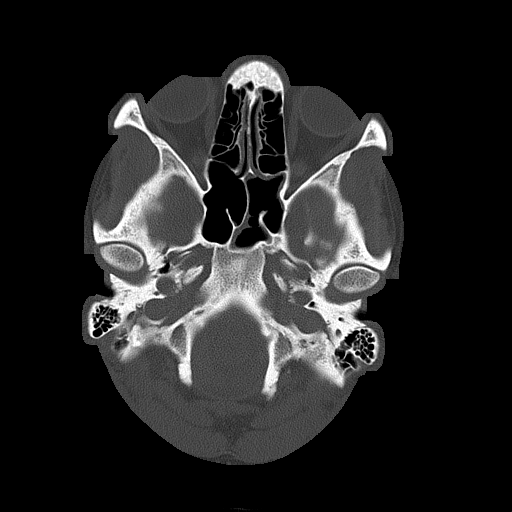
[im 14/69  bone]
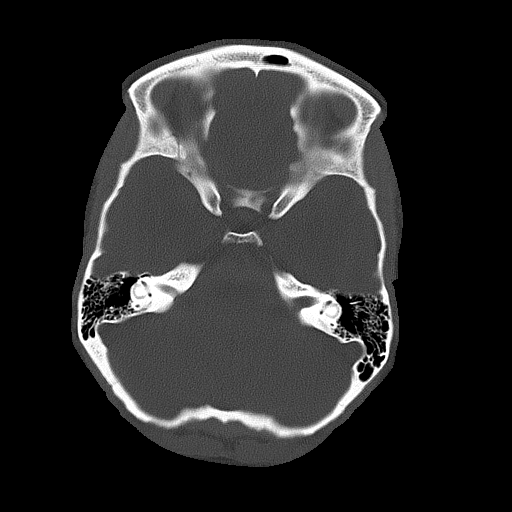
[im 21/69  bone]
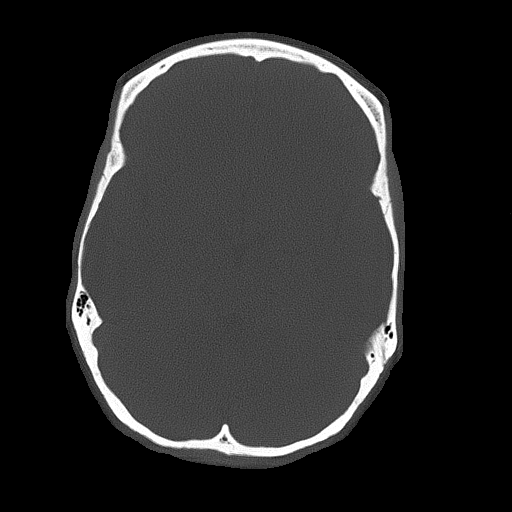

[Series 4: head without · axial · non-contrast · 0.39mm/px · z∈[+42,+142]mm · 7 of 28 slices shown, 9 images]
[im 4/28  brain]
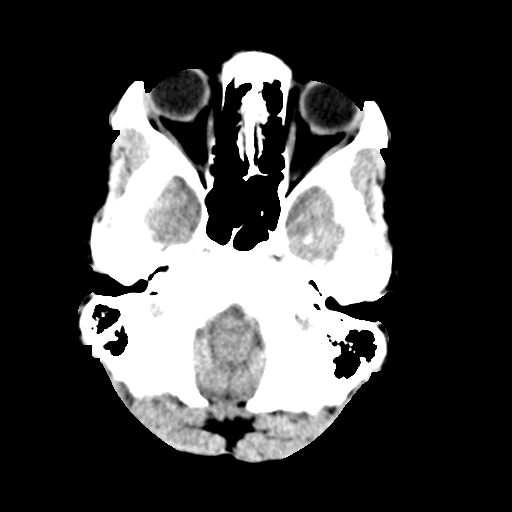
[im 4/28  bone]
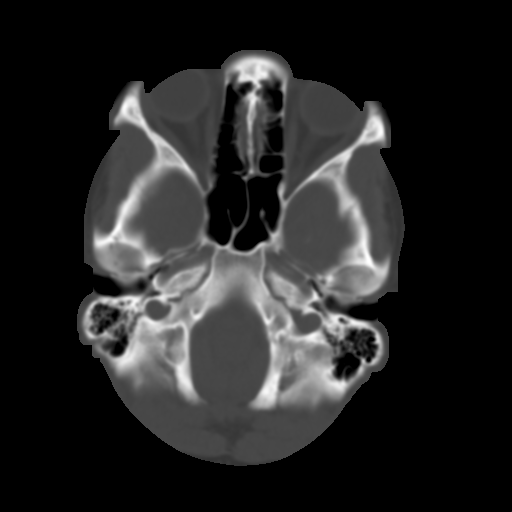
[im 7/28  brain]
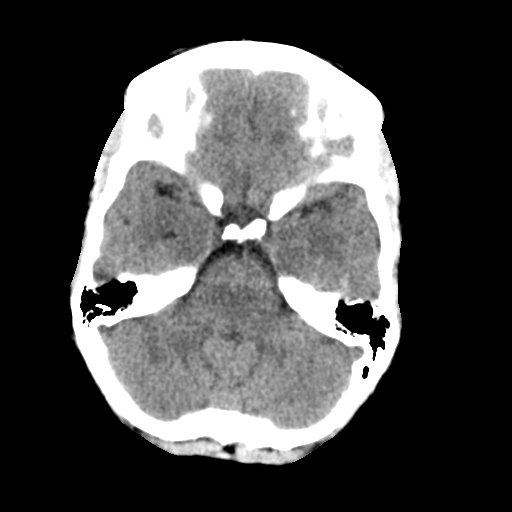
[im 11/28  brain]
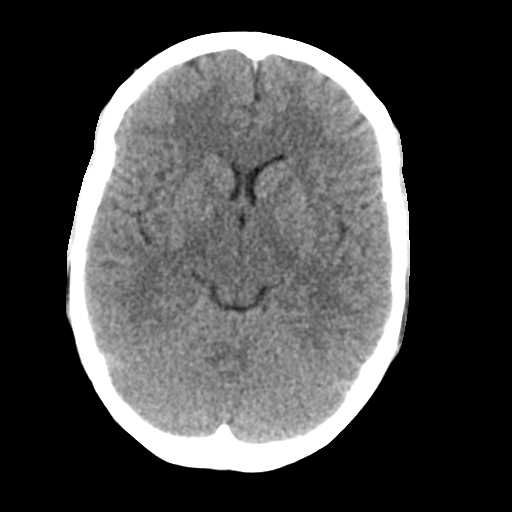
[im 14/28  brain]
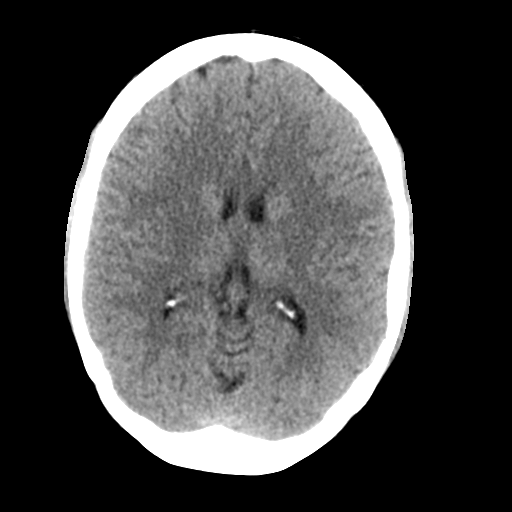
[im 17/28  brain]
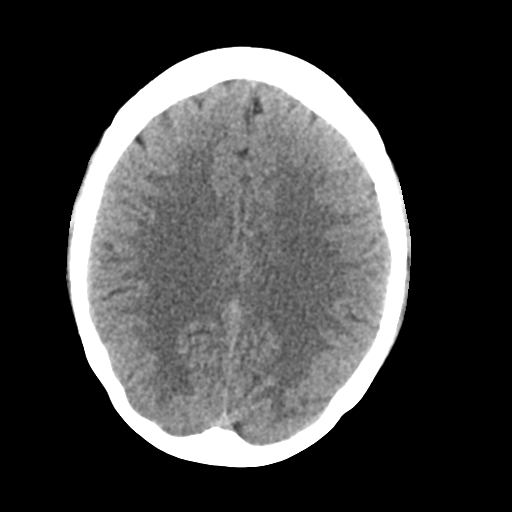
[im 17/28  bone]
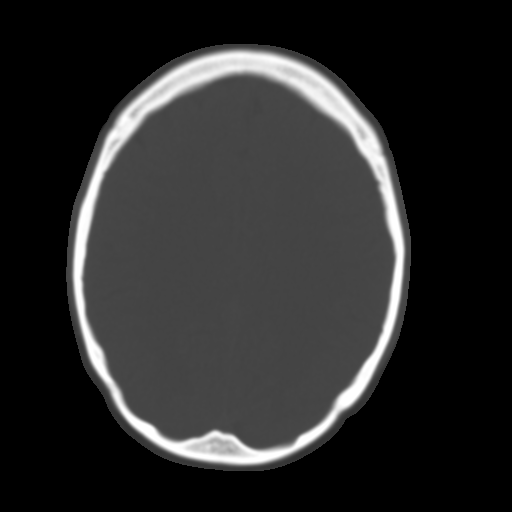
[im 21/28  brain]
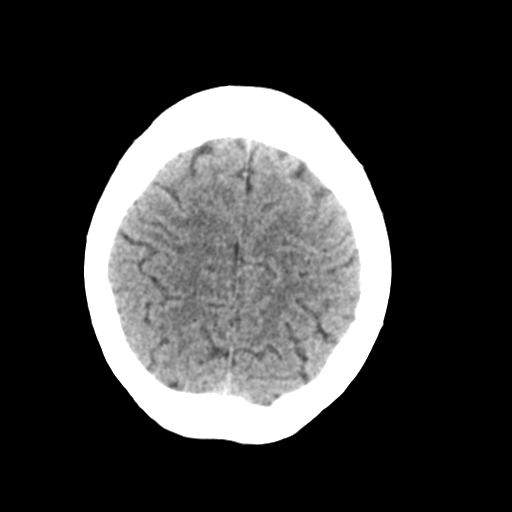
[im 24/28  brain]
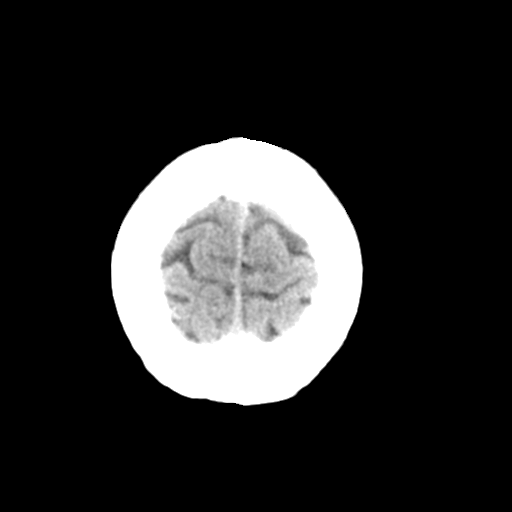

[Series 5: head without cor · coronal · non-contrast · 0.29mm/px · 3 of 61 slices shown]
[im 21/61  brain]
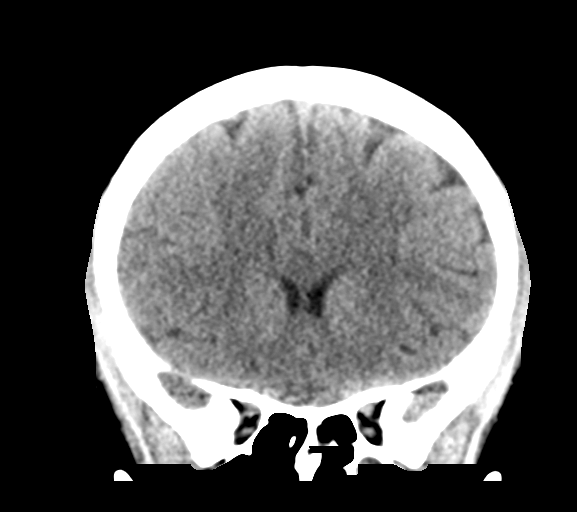
[im 27/61  brain]
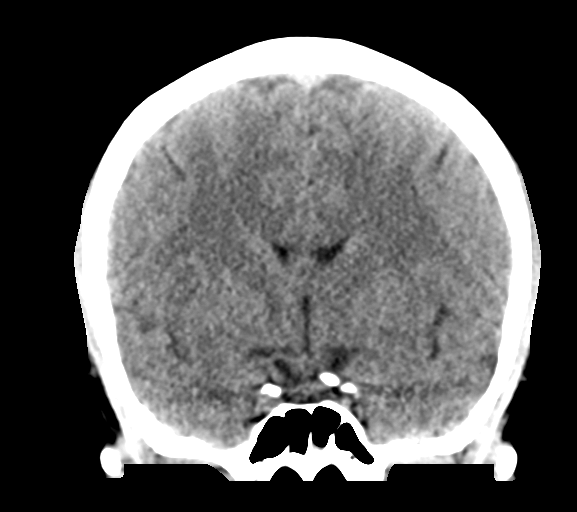
[im 34/61  brain]
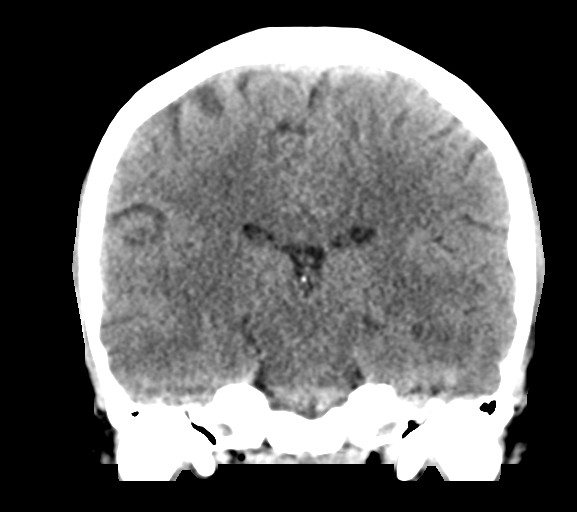

[Series 6: head without sag · sagittal · non-contrast · 0.28mm/px · 3 of 49 slices shown]
[im 17/49  brain]
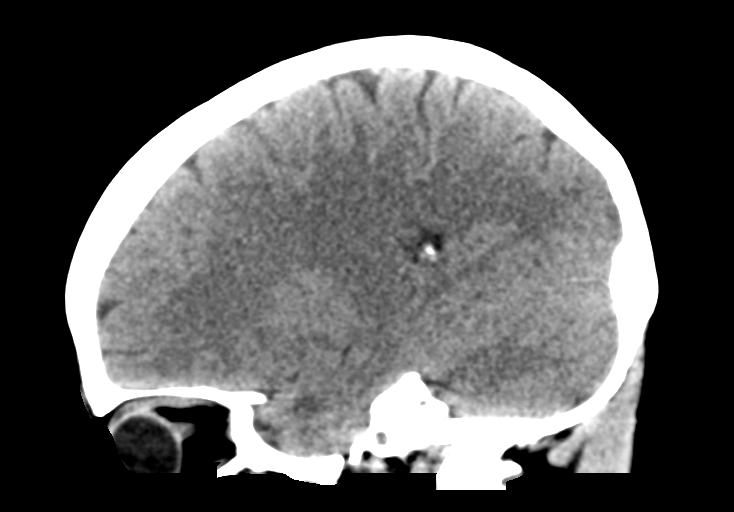
[im 25/49  brain]
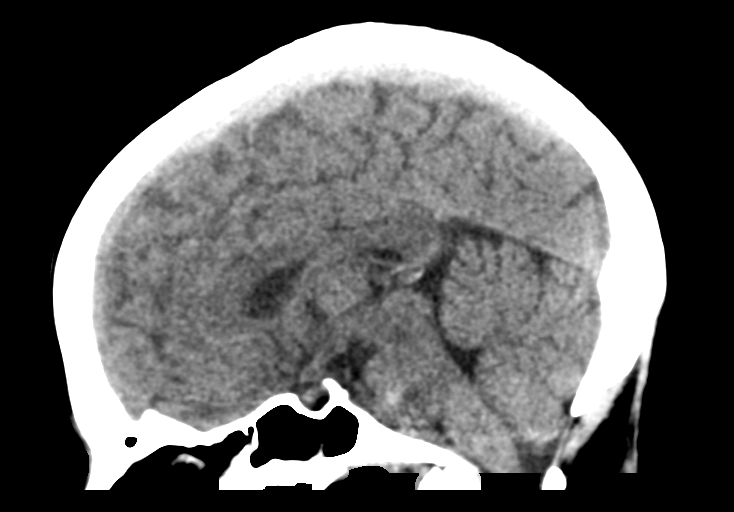
[im 33/49  brain]
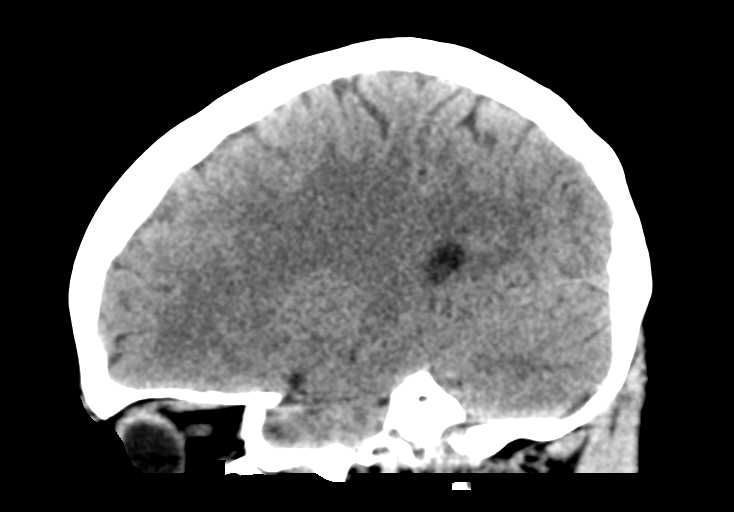

[16 of 47 positions shown; findings below may reference images not displayed]

FINDINGS: Brain: No acute intracranial abnormality. Specifically, no
hemorrhage, hydrocephalus, mass lesion, acute infarction, or
significant intracranial injury.

Vascular: No hyperdense vessel or unexpected calcification.

Skull: No acute calvarial abnormality.

Sinuses/Orbits: Air-fluid level in the visualized left maxillary
sinus.

Other: None
IMPRESSION: No acute intracranial abnormality.

Acute left maxillary sinusitis.

## 2022-11-02 DIAGNOSIS — M5412 Radiculopathy, cervical region: Secondary | ICD-10-CM | POA: Diagnosis not present

## 2023-02-15 DIAGNOSIS — E663 Overweight: Secondary | ICD-10-CM | POA: Diagnosis not present

## 2023-02-15 DIAGNOSIS — L259 Unspecified contact dermatitis, unspecified cause: Secondary | ICD-10-CM | POA: Diagnosis not present

## 2023-02-15 DIAGNOSIS — Z6828 Body mass index (BMI) 28.0-28.9, adult: Secondary | ICD-10-CM | POA: Diagnosis not present

## 2023-05-06 ENCOUNTER — Encounter (HOSPITAL_COMMUNITY): Payer: BC Managed Care – PPO

## 2023-05-20 ENCOUNTER — Emergency Department (HOSPITAL_COMMUNITY): Payer: BC Managed Care – PPO

## 2023-05-20 ENCOUNTER — Emergency Department (HOSPITAL_COMMUNITY)
Admission: EM | Admit: 2023-05-20 | Discharge: 2023-05-20 | Disposition: A | Discharge status: Home / Self Care | Payer: BC Managed Care – PPO | Attending: Emergency Medicine | Admitting: Emergency Medicine

## 2023-05-20 ENCOUNTER — Other Ambulatory Visit: Payer: Self-pay

## 2023-05-20 ENCOUNTER — Encounter (HOSPITAL_COMMUNITY): Payer: Self-pay

## 2023-05-20 DIAGNOSIS — R519 Headache, unspecified: Secondary | ICD-10-CM | POA: Diagnosis not present

## 2023-05-20 DIAGNOSIS — E872 Acidosis, unspecified: Secondary | ICD-10-CM | POA: Diagnosis not present

## 2023-05-20 DIAGNOSIS — R070 Pain in throat: Secondary | ICD-10-CM | POA: Diagnosis not present

## 2023-05-20 DIAGNOSIS — Z87891 Personal history of nicotine dependence: Secondary | ICD-10-CM | POA: Diagnosis not present

## 2023-05-20 DIAGNOSIS — Z1152 Encounter for screening for COVID-19: Secondary | ICD-10-CM | POA: Insufficient documentation

## 2023-05-20 DIAGNOSIS — N644 Mastodynia: Secondary | ICD-10-CM | POA: Insufficient documentation

## 2023-05-20 DIAGNOSIS — E8729 Other acidosis: Secondary | ICD-10-CM

## 2023-05-20 LAB — BASIC METABOLIC PANEL
Anion gap: 18 — ABNORMAL HIGH (ref 5–15)
BUN: 14 mg/dL (ref 6–20)
CO2: 22 mmol/L (ref 22–32)
Calcium: 9.6 mg/dL (ref 8.9–10.3)
Chloride: 96 mmol/L — ABNORMAL LOW (ref 98–111)
Creatinine, Ser: 0.7 mg/dL (ref 0.44–1.00)
GFR, Estimated: >60 mL/min (ref >60)
Glucose, Bld: 93 mg/dL (ref 70–99)
Potassium: 3.8 mmol/L (ref 3.5–5.1)
Sodium: 136 mmol/L (ref 135–145)

## 2023-05-20 LAB — CBC
HCT: 44.1 % (ref 36.0–46.0)
Hemoglobin: 14.6 g/dL (ref 12.0–15.0)
MCH: 29.6 pg (ref 26.0–34.0)
MCHC: 33.1 g/dL (ref 30.0–36.0)
MCV: 89.3 fL (ref 80.0–100.0)
Platelets: 341 10*3/uL (ref 150–400)
RBC: 4.94 MIL/uL (ref 3.87–5.11)
RDW: 12.7 % (ref 11.5–15.5)
WBC: 5.3 10*3/uL (ref 4.0–10.5)
nRBC: 0 % (ref 0.0–0.2)

## 2023-05-20 LAB — SARS CORONAVIRUS 2 BY RT PCR: SARS Coronavirus 2 by RT PCR: NEGATIVE

## 2023-05-20 LAB — GROUP A STREP BY PCR: Group A Strep by PCR: NOT DETECTED

## 2023-05-20 MED ORDER — IOHEXOL 300 MG/ML  SOLN
75.0000 mL | Freq: Once | INTRAMUSCULAR | Status: AC | PRN
  Administered 2023-05-20: 75 mL via INTRAVENOUS

## 2023-05-20 MED ORDER — SODIUM CHLORIDE 0.9 % IV BOLUS
1000.0000 mL | Freq: Once | INTRAVENOUS | Status: AC
  Administered 2023-05-20: 1000 mL via INTRAVENOUS

## 2023-05-20 NOTE — Discharge Instructions (Signed)
This patient is having some right-sided breast pain and subjective swelling, I recommend that she have a mammogram at the imaging center.  Please follow-up with the ENT physician's contact information I provided above for further evaluation of your right sided throat pain, and possible swallow study.  I suspect that your anion gap is secondary to the fact that you have not eaten anything today and your alcohol use.  I would decrease your alcohol use and make sure that you are eating and drinking plenty of fluids.  I recommend that you recheck this value at your primary care doctor within the next 2 to 4 weeks.

## 2023-05-20 NOTE — ED Provider Notes (Signed)
[place] EMERGENCY DEPARTMENT AT [facility] Provider Note   CSN: [id number] Arrival date & time: 05/20/23  1504     History  Chief Complaint  Patient presents with   Sore Throat    [name] is a 32 y.o. female past medical history significant for history of tobacco use, IBS, psoriasis who presents concern for right-sided throat and face pain for 2 months.  Patient reports that she feels like there is a lump in the back of throat, and has some difficulty swallowing/gets choked easily.  Patient also reports that has been having some right sided breast pain but that she attempted to get a mammogram but was unable to schedule 1.  Patient reports that they said that she needed a referral but her PCP refused to write her one.   Sore Throat       Home Medications Prior to Admission medications   Not on File      Allergies    Sulfa drugs cross reactors and Sulfa antibiotics    Review of Systems   Review of Systems  All other systems reviewed and are negative.   Physical Exam Updated Vital Signs BP 112/79 (BP Location: Left Arm)   Pulse 82   Temp 99.1 F (37.3 C) (Oral)   Resp 16   Ht 4\' 11"  (1.499 m)   Wt 63.5 kg   SpO2 99%   BMI 28.28 kg/m  Physical Exam Vitals and nursing note reviewed.  Constitutional:      General: She is not in acute distress.    Appearance: Normal appearance.  HENT:     Head: Normocephalic and atraumatic.     Mouth/Throat:     Comments: Patient with some mild soft tissue swelling posterior oropharynx with right sided uvula deviation.  No significant tonsillar edema or exudate.  No floor of mouth swelling.  No significant anterior cervical neck tenderness or induration.  No noted cervical lymphadenopathy. Eyes:     General:        Right eye: No discharge.        Left eye: No discharge.  Cardiovascular:     Rate and Rhythm: Normal rate and regular rhythm.  Pulmonary:     Effort: Pulmonary effort is normal. No respiratory  distress.  Chest:     Comments: Offered breast exam but patient deferred stating that she does not feel a palpable mass just feels that there is some pain in the right breast and that it is slightly more swollen than the left 1 overall. Musculoskeletal:        General: No deformity.  Skin:    General: Skin is warm and dry.  Neurological:     Mental Status: She is alert and oriented to person, place, and time.  Psychiatric:        Mood and Affect: Mood normal.        Behavior: Behavior normal.     ED Results / Procedures / Treatments   Labs (all labs ordered are listed, but only abnormal results are displayed) Labs Reviewed  BASIC METABOLIC PANEL - Abnormal; Notable for the following components:      Result Value   Chloride 96 (*)    Anion gap 18 (*)    All other components within normal limits  SARS CORONAVIRUS 2 BY RT PCR  GROUP A STREP BY PCR  CBC    EKG None  Radiology CT Soft Tissue Neck W Contrast  Result Date: 05/20/2023 CLINICAL DATA:  Epiglottitis or tonsillitis suspected. Right-sided throat and facial pain x2 months. EXAM: CT NECK WITH CONTRAST TECHNIQUE: Multidetector CT imaging of the neck was performed using the standard protocol following the bolus administration of intravenous contrast. RADIATION DOSE REDUCTION: This exam was performed according to the departmental dose-optimization program which includes automated exposure control, adjustment of the mA and/or kV according to patient size and/or use of iterative reconstruction technique. CONTRAST:  75mL OMNIPAQUE IOHEXOL 300 MG/ML  SOLN COMPARISON:  None Available. FINDINGS: Pharynx and larynx: Normal. No mass or swelling. Salivary glands: No inflammation, mass, or stone. Thyroid: Normal. Lymph nodes: No suspicious cervical lymphadenopathy. Vascular: Unremarkable. Limited intracranial: Unremarkable. Visualized orbits: Normal. Mastoids and visualized paranasal sinuses: Well aerated. Skeleton: Unremarkable. Upper chest:  Unremarkable. Other: No mass or fluid collection in the neck. IMPRESSION: No mass or fluid collection in the neck. No evidence of epiglottitis or tonsillitis. Electronically Signed   By: Orvan Falconer M.D.   On: 05/20/2023 18:06    Procedures Procedures    Medications Ordered in ED Medications  iohexol (OMNIPAQUE) 300 MG/ML solution 75 mL (75 mLs Intravenous Contrast Given 05/20/23 1719)  sodium chloride 0.9 % bolus 1,000 mL (1,000 mLs Intravenous New Bag/Given 05/20/23 1826)    ED Course/ Medical Decision Making/ A&P Clinical Course as of 05/20/23 1851  Fri May 20, 2023  1545 [facility] -- tuesdays [CP]    Clinical Course User Index [CP] Olene Floss, PA-C                                 Medical Decision Making  This patient is a 32 y.o. female  who presents to the ED for concern of right sided throat pain, right breast pain for months.   Differential diagnoses prior to evaluation: The emergent differential diagnosis includes, but is not limited to,  epiglottitis, tonsillitis, throat cancer, fibroadenoma, abscess, fibrocystic breast disease,  . This is not an exhaustive differential.   Past Medical History / Co-morbidities / Social History: IBS, psoriasis, former smoker  Additional history: Chart reviewed. Pertinent results include: Reviewed lab work, imaging from previous emergency department visits, outpatient neurology visits.  Physical Exam: Physical exam performed. The pertinent findings include: Patient with questionable soft tissue swelling, mild uvular deviation in the posterior throat but intact swallow, no induration of anterior cervical neck.  I do not appreciate any facial swelling or abnormality although she endorses some tenderness over the right cheek.  She deferred breast exam after discussion reporting that she does not have any lesion on the breast  Lab Tests/Imaging studies: I personally interpreted labs/imaging and the pertinent  results include: CBC unremarkable, COVID-negative, strep PCR negative.  BMP is notable for mild chloride deficit at 96, she does have an anion gap at 18, after discussion with patient she reports she has not had anything to eat all day, has not had anything to drink for the most part today, and has had some increased alcohol use recently.  Discussed I think that her anion gap is likely secondary to alcoholic versus starvation ketoacidosis.  She denies any ethanol, antifreeze ingestion, low clinical suspicion for uremia with normal BUN, low clinical suspicion for isoniazid, paraldehyde, or salicylate poisoning.. I independently interpreted CT soft tissue neck w contrast, which showed no acute abnormality. I agree with the radiologist interpretation.   Medications: I ordered medication including fluid bolus for anion gap acidosis, and encourage patient to eat, drink  and decrease her alcohol use..  I have reviewed the patients home medicines and have made adjustments as needed.   Disposition: After consideration of the diagnostic results and the patients response to treatment, I feel that patient would benefit from ENT evaluation for her unexplained throat pain, difficulty swallowing for possible swallow study.   emergency department workup does not suggest an emergent condition requiring admission or immediate intervention beyond what has been performed at this time. The plan is: as above. The patient is safe for discharge and has been instructed to return immediately for worsening symptoms, change in symptoms or any other concerns.  Final Clinical Impression(s) / ED Diagnoses Final diagnoses:  Throat pain  Breast pain in female  Metabolic acidosis, increased anion gap    Rx / DC Orders ED Discharge Orders     None         [place] 05/20/23 1851    Bethann Berkshire, MD 05/21/23 1239

## 2023-05-20 NOTE — ED Triage Notes (Signed)
Pt complains of right sided throat and face pain x 2 months. A few weeks ago felt a lump in back of throat. Pt states sometimes it feels like it is difficult to swallow and gets choked easily. Also complains of right breast pain but unable to get mammogram. No worse pain today but feels tired all the time.

## 2023-05-30 DIAGNOSIS — H5711 Ocular pain, right eye: Secondary | ICD-10-CM | POA: Diagnosis not present

## 2023-05-30 DIAGNOSIS — M3501 Sicca syndrome with keratoconjunctivitis: Secondary | ICD-10-CM | POA: Diagnosis not present

## 2023-05-30 DIAGNOSIS — L409 Psoriasis, unspecified: Secondary | ICD-10-CM | POA: Diagnosis not present

## 2023-06-09 DIAGNOSIS — L400 Psoriasis vulgaris: Secondary | ICD-10-CM | POA: Diagnosis not present

## 2023-06-09 DIAGNOSIS — L218 Other seborrheic dermatitis: Secondary | ICD-10-CM | POA: Diagnosis not present

## 2023-06-14 DIAGNOSIS — L4052 Psoriatic arthritis mutilans: Secondary | ICD-10-CM | POA: Diagnosis not present

## 2023-06-14 DIAGNOSIS — L400 Psoriasis vulgaris: Secondary | ICD-10-CM | POA: Diagnosis not present

## 2023-06-14 DIAGNOSIS — Z79899 Other long term (current) drug therapy: Secondary | ICD-10-CM | POA: Diagnosis not present

## 2023-07-13 DIAGNOSIS — L400 Psoriasis vulgaris: Secondary | ICD-10-CM | POA: Diagnosis not present

## 2023-09-10 ENCOUNTER — Emergency Department (HOSPITAL_COMMUNITY): Payer: BC Managed Care – PPO

## 2023-09-10 ENCOUNTER — Emergency Department (HOSPITAL_COMMUNITY)
Admission: EM | Admit: 2023-09-10 | Discharge: 2023-09-10 | Disposition: A | Discharge status: Home / Self Care | Payer: BC Managed Care – PPO | Attending: Emergency Medicine | Admitting: Emergency Medicine

## 2023-09-10 ENCOUNTER — Encounter (HOSPITAL_COMMUNITY): Payer: Self-pay

## 2023-09-10 ENCOUNTER — Other Ambulatory Visit: Payer: Self-pay

## 2023-09-10 DIAGNOSIS — M546 Pain in thoracic spine: Secondary | ICD-10-CM | POA: Diagnosis not present

## 2023-09-10 DIAGNOSIS — G8929 Other chronic pain: Secondary | ICD-10-CM | POA: Diagnosis not present

## 2023-09-10 DIAGNOSIS — M542 Cervicalgia: Secondary | ICD-10-CM | POA: Insufficient documentation

## 2023-09-10 DIAGNOSIS — R079 Chest pain, unspecified: Secondary | ICD-10-CM | POA: Diagnosis not present

## 2023-09-10 DIAGNOSIS — M545 Low back pain, unspecified: Secondary | ICD-10-CM | POA: Diagnosis not present

## 2023-09-10 DIAGNOSIS — M47812 Spondylosis without myelopathy or radiculopathy, cervical region: Secondary | ICD-10-CM | POA: Diagnosis not present

## 2023-09-10 DIAGNOSIS — M549 Dorsalgia, unspecified: Secondary | ICD-10-CM

## 2023-09-10 LAB — CBC WITH DIFFERENTIAL/PLATELET
Abs Immature Granulocytes: 0.01 10*3/uL (ref 0.00–0.07)
Basophils Absolute: 0 10*3/uL (ref 0.0–0.1)
Basophils Relative: 0 %
Eosinophils Absolute: 0.1 10*3/uL (ref 0.0–0.5)
Eosinophils Relative: 2 %
HCT: 36.5 % (ref 36.0–46.0)
Hemoglobin: 12.3 g/dL (ref 12.0–15.0)
Immature Granulocytes: 0 %
Lymphocytes Relative: 34 %
Lymphs Abs: 2 10*3/uL (ref 0.7–4.0)
MCH: 30.4 pg (ref 26.0–34.0)
MCHC: 33.7 g/dL (ref 30.0–36.0)
MCV: 90.3 fL (ref 80.0–100.0)
Monocytes Absolute: 0.8 10*3/uL (ref 0.1–1.0)
Monocytes Relative: 13 %
Neutro Abs: 3 10*3/uL (ref 1.7–7.7)
Neutrophils Relative %: 51 %
Platelets: 265 10*3/uL (ref 150–400)
RBC: 4.04 MIL/uL (ref 3.87–5.11)
RDW: 12.5 % (ref 11.5–15.5)
WBC: 6 10*3/uL (ref 4.0–10.5)
nRBC: 0 % (ref 0.0–0.2)

## 2023-09-10 LAB — BASIC METABOLIC PANEL
Anion gap: 8 (ref 5–15)
BUN: 17 mg/dL (ref 6–20)
CO2: 24 mmol/L (ref 22–32)
Calcium: 9.3 mg/dL (ref 8.9–10.3)
Chloride: 106 mmol/L (ref 98–111)
Creatinine, Ser: 0.76 mg/dL (ref 0.44–1.00)
GFR, Estimated: >60 mL/min (ref >60)
Glucose, Bld: 91 mg/dL (ref 70–99)
Potassium: 3.9 mmol/L (ref 3.5–5.1)
Sodium: 138 mmol/L (ref 135–145)

## 2023-09-10 MED ORDER — PREDNISONE 10 MG PO TABS: 20.0000 mg | ORAL_TABLET | Freq: Every day | ORAL | 0 refills | Status: AC

## 2023-09-10 MED ORDER — OXYCODONE-ACETAMINOPHEN 5-325 MG PO TABS: 1.0000 | ORAL_TABLET | Freq: Four times a day (QID) | ORAL | 0 refills | Status: AC | PRN

## 2023-09-10 NOTE — ED Notes (Signed)
Patient transported to X-ray 

## 2023-09-10 NOTE — Discharge Instructions (Addendum)
Follow-up with Dr. Romeo Apple for your back.  Follow-up with Dr. Dione Booze the next few weeks to get another eye exam

## 2023-09-10 NOTE — ED Triage Notes (Signed)
Muscular neck pain  Radiates to the top of RIGHT shoulder Ongoing for several years  Pt under the care of a chiropractor x4 years  Pain feels worse today

## 2023-09-11 NOTE — ED Provider Notes (Signed)
[place] EMERGENCY DEPARTMENT AT [facility] Provider Note   CSN: [id number] Arrival date & time: 09/10/23  2139     History  Chief Complaint  Patient presents with   Muscle Pain    [name] is a 32 y.o. female.  Patient has been having upper back and neck pain for a number month.  She states the pain seems to be getting worse  The history is provided by the patient and medical records. No language interpreter was used.  Muscle Pain This is a recurrent problem. The current episode started more than 1 week ago. The problem occurs constantly. The problem has not changed since onset.Pertinent negatives include no chest pain, no abdominal pain and no headaches. Nothing aggravates the symptoms. Nothing relieves the symptoms. She has tried nothing for the symptoms.       Home Medications Prior to Admission medications   Medication Sig Start Date End Date Taking? Authorizing Provider  oxyCODONE-acetaminophen (PERCOCET/ROXICET) 5-325 MG tablet Take 1 tablet by mouth every 6 (six) hours as needed for severe pain (pain score 7-10). 09/10/23  Yes Bethann Berkshire, MD  predniSONE (DELTASONE) 10 MG tablet Take 2 tablets (20 mg total) by mouth daily. 09/10/23  Yes Bethann Berkshire, MD      Allergies    Sulfa drugs cross reactors and Sulfa antibiotics    Review of Systems   Review of Systems  Constitutional:  Negative for appetite change and fatigue.  HENT:  Negative for congestion, ear discharge and sinus pressure.   Eyes:  Negative for discharge.  Respiratory:  Negative for cough.   Cardiovascular:  Negative for chest pain.  Gastrointestinal:  Negative for abdominal pain and diarrhea.  Genitourinary:  Negative for frequency and hematuria.  Musculoskeletal:  Positive for back pain.       Trapezius muscle pain and right neck pain  Skin:  Negative for rash.  Neurological:  Negative for seizures and headaches.  Psychiatric/Behavioral:  Negative for hallucinations.      Physical Exam Updated Vital Signs BP 107/65 (BP Location: Right Arm)   Pulse 84   Temp 98.1 F (36.7 C) (Oral)   Resp 18   Ht 4\' 11"  (1.499 m)   Wt 64.4 kg   SpO2 99%   BMI 28.68 kg/m  Physical Exam Vitals and nursing note reviewed.  Constitutional:      Appearance: She is well-developed.  HENT:     Head: Normocephalic.     Nose: Nose normal.  Eyes:     General: No scleral icterus.    Conjunctiva/sclera: Conjunctivae normal.  Neck:     Thyroid: No thyromegaly.  Cardiovascular:     Rate and Rhythm: Normal rate and regular rhythm.     Heart sounds: No murmur heard.    No friction rub. No gallop.  Pulmonary:     Breath sounds: No stridor. No wheezing or rales.  Chest:     Chest wall: No tenderness.  Abdominal:     General: There is no distension.     Tenderness: There is no abdominal tenderness. There is no rebound.  Musculoskeletal:     Cervical back: Neck supple.     Comments: Tenderness to right trapezius muscle and sternocleidomastoid muscle  Lymphadenopathy:     Cervical: No cervical adenopathy.  Skin:    Findings: No erythema or rash.  Neurological:     Mental Status: She is alert and oriented to person, place, and time.     Motor: No abnormal muscle  tone.     Coordination: Coordination normal.  Psychiatric:        Behavior: Behavior normal.     ED Results / Procedures / Treatments   Labs (all labs ordered are listed, but only abnormal results are displayed) Labs Reviewed  CBC WITH DIFFERENTIAL/PLATELET  BASIC METABOLIC PANEL    EKG None  Radiology DG Cervical Spine Complete  Result Date: 09/10/2023 CLINICAL DATA:  Neck pain EXAM: CERVICAL SPINE - COMPLETE 4+ VIEW COMPARISON:  None Available. FINDINGS: Normal cervical lordosis. No evidence of fracture or dislocation. Vertebral body heights are maintained. Mild degenerative changes at C6-7. No prevertebral soft tissue swelling. Left neural foramina are patent. Evaluation of the right neural  foramina is frame by obliquity. Visualized lung apices are clear. IMPRESSION: Negative cervical spine radiographs. Electronically Signed   By: Charline Bills M.D.   On: 09/10/2023 22:47   DG Chest 2 View  Result Date: 09/10/2023 CLINICAL DATA:  Chronic chest pain EXAM: CHEST - 2 VIEW COMPARISON:  None Available. FINDINGS: Lungs are clear.  No pleural effusion or pneumothorax. The heart is normal in size. Visualized osseous structures are within normal limits. IMPRESSION: Normal chest radiographs. Electronically Signed   By: Charline Bills M.D.   On: 09/10/2023 22:47    Procedures Procedures    Medications Ordered in ED Medications - No data to display  ED Course/ Medical Decision Making/ A&P                                 Medical Decision Making Amount and/or Complexity of Data Reviewed Labs: ordered. Radiology: ordered.  Risk Prescription drug management.   Patient with muscle strain.  This has been going on for a long time.  She is started on some prednisone and given Percocet and referred to orthopedics.  She also has history of floaters in her eyes and has been referred to ophthalmology for thorough exam        Final Clinical Impression(s) / ED Diagnoses Final diagnoses:  Upper back pain    Rx / DC Orders ED Discharge Orders          Ordered    predniSONE (DELTASONE) 10 MG tablet  Daily        09/10/23 2312    oxyCODONE-acetaminophen (PERCOCET/ROXICET) 5-325 MG tablet  Every 6 hours PRN        09/10/23 2312              Bethann Berkshire, MD 09/11/23 1148

## 2023-09-13 DIAGNOSIS — H5310 Unspecified subjective visual disturbances: Secondary | ICD-10-CM | POA: Diagnosis not present

## 2023-09-13 DIAGNOSIS — H43393 Other vitreous opacities, bilateral: Secondary | ICD-10-CM | POA: Diagnosis not present

## 2023-09-13 DIAGNOSIS — G43809 Other migraine, not intractable, without status migrainosus: Secondary | ICD-10-CM | POA: Diagnosis not present

## 2023-11-02 DIAGNOSIS — M791 Myalgia, unspecified site: Secondary | ICD-10-CM | POA: Diagnosis not present

## 2023-11-02 DIAGNOSIS — Z6827 Body mass index (BMI) 27.0-27.9, adult: Secondary | ICD-10-CM | POA: Diagnosis not present

## 2023-11-02 DIAGNOSIS — M545 Low back pain, unspecified: Secondary | ICD-10-CM | POA: Diagnosis not present

## 2023-11-02 DIAGNOSIS — L409 Psoriasis, unspecified: Secondary | ICD-10-CM | POA: Diagnosis not present

## 2023-11-02 DIAGNOSIS — R6884 Jaw pain: Secondary | ICD-10-CM | POA: Diagnosis not present

## 2023-11-02 DIAGNOSIS — G8929 Other chronic pain: Secondary | ICD-10-CM | POA: Diagnosis not present

## 2023-11-25 DIAGNOSIS — M542 Cervicalgia: Secondary | ICD-10-CM | POA: Diagnosis not present

## 2023-11-25 DIAGNOSIS — M791 Myalgia, unspecified site: Secondary | ICD-10-CM | POA: Diagnosis not present

## 2023-11-25 DIAGNOSIS — Z0000 Encounter for general adult medical examination without abnormal findings: Secondary | ICD-10-CM | POA: Diagnosis not present

## 2023-11-25 DIAGNOSIS — Z0001 Encounter for general adult medical examination with abnormal findings: Secondary | ICD-10-CM | POA: Diagnosis not present

## 2023-11-25 DIAGNOSIS — R1310 Dysphagia, unspecified: Secondary | ICD-10-CM | POA: Diagnosis not present

## 2023-11-25 DIAGNOSIS — Z23 Encounter for immunization: Secondary | ICD-10-CM | POA: Diagnosis not present

## 2023-11-25 DIAGNOSIS — R300 Dysuria: Secondary | ICD-10-CM | POA: Diagnosis not present

## 2023-11-25 DIAGNOSIS — Z01419 Encounter for gynecological examination (general) (routine) without abnormal findings: Secondary | ICD-10-CM | POA: Diagnosis not present

## 2023-11-25 DIAGNOSIS — L409 Psoriasis, unspecified: Secondary | ICD-10-CM | POA: Diagnosis not present

## 2023-12-16 DIAGNOSIS — G43909 Migraine, unspecified, not intractable, without status migrainosus: Secondary | ICD-10-CM | POA: Diagnosis not present

## 2023-12-16 DIAGNOSIS — M26629 Arthralgia of temporomandibular joint, unspecified side: Secondary | ICD-10-CM | POA: Diagnosis not present

## 2023-12-16 DIAGNOSIS — R1310 Dysphagia, unspecified: Secondary | ICD-10-CM | POA: Diagnosis not present

## 2024-11-08 ENCOUNTER — Ambulatory Visit
